# Patient Record
Sex: Male | Born: 1940 | Race: White | Hispanic: No | Marital: Married | Smoking: Former smoker
Health system: Southern US, Community
[De-identification: ages and names within clinical notes are randomized; demographics above are authoritative.]

## PROBLEM LIST (undated history)

## (undated) DIAGNOSIS — E785 Hyperlipidemia, unspecified: Secondary | ICD-10-CM

## (undated) DIAGNOSIS — I77810 Thoracic aortic ectasia: Secondary | ICD-10-CM

## (undated) DIAGNOSIS — I4510 Unspecified right bundle-branch block: Secondary | ICD-10-CM

## (undated) DIAGNOSIS — I071 Rheumatic tricuspid insufficiency: Secondary | ICD-10-CM

## (undated) DIAGNOSIS — D126 Benign neoplasm of colon, unspecified: Secondary | ICD-10-CM

## (undated) DIAGNOSIS — H9191 Unspecified hearing loss, right ear: Secondary | ICD-10-CM

## (undated) DIAGNOSIS — Z136 Encounter for screening for cardiovascular disorders: Secondary | ICD-10-CM

## (undated) DIAGNOSIS — E119 Type 2 diabetes mellitus without complications: Secondary | ICD-10-CM

## (undated) DIAGNOSIS — M109 Gout, unspecified: Secondary | ICD-10-CM

## (undated) DIAGNOSIS — I2510 Atherosclerotic heart disease of native coronary artery without angina pectoris: Secondary | ICD-10-CM

## (undated) DIAGNOSIS — M1990 Unspecified osteoarthritis, unspecified site: Secondary | ICD-10-CM

## (undated) DIAGNOSIS — I10 Essential (primary) hypertension: Secondary | ICD-10-CM

## (undated) DIAGNOSIS — N400 Enlarged prostate without lower urinary tract symptoms: Secondary | ICD-10-CM

## (undated) DIAGNOSIS — G4733 Obstructive sleep apnea (adult) (pediatric): Secondary | ICD-10-CM

## (undated) DIAGNOSIS — I358 Other nonrheumatic aortic valve disorders: Secondary | ICD-10-CM

## (undated) DIAGNOSIS — H3530 Unspecified macular degeneration: Secondary | ICD-10-CM

## (undated) DIAGNOSIS — IMO0002 Reserved for concepts with insufficient information to code with codable children: Secondary | ICD-10-CM

## (undated) DIAGNOSIS — I4892 Unspecified atrial flutter: Secondary | ICD-10-CM

## (undated) HISTORY — DX: Unspecified right bundle-branch block: I45.10

## (undated) HISTORY — DX: Gout, unspecified: M10.9

## (undated) HISTORY — DX: Unspecified hearing loss, right ear: H91.91

## (undated) HISTORY — DX: Encounter for screening for cardiovascular disorders: Z13.6

## (undated) HISTORY — DX: Other nonrheumatic aortic valve disorders: I35.8

## (undated) HISTORY — DX: Essential (primary) hypertension: I10

## (undated) HISTORY — DX: Unspecified atrial flutter: I48.92

## (undated) HISTORY — DX: Reserved for concepts with insufficient information to code with codable children: IMO0002

## (undated) HISTORY — PX: HERNIA REPAIR: SHX51

## (undated) HISTORY — DX: Obstructive sleep apnea (adult) (pediatric): G47.33

## (undated) HISTORY — DX: Unspecified osteoarthritis, unspecified site: M19.90

## (undated) HISTORY — DX: Atherosclerotic heart disease of native coronary artery without angina pectoris: I25.10

## (undated) HISTORY — PX: APPENDECTOMY: SHX54

## (undated) HISTORY — DX: Hyperlipidemia, unspecified: E78.5

## (undated) HISTORY — DX: Unspecified macular degeneration: H35.30

## (undated) HISTORY — DX: Rheumatic tricuspid insufficiency: I07.1

## (undated) HISTORY — DX: Benign neoplasm of colon, unspecified: D12.6

## (undated) HISTORY — DX: Type 2 diabetes mellitus without complications: E11.9

## (undated) HISTORY — PX: BUNIONECTOMY: SHX129

## (undated) HISTORY — DX: Thoracic aortic ectasia: I77.810

## (undated) HISTORY — DX: Benign prostatic hyperplasia without lower urinary tract symptoms: N40.0

---

## 1991-10-07 HISTORY — PX: OTHER SURGICAL HISTORY: SHX169

## 2000-03-09 ENCOUNTER — Encounter: Payer: Self-pay | Admitting: Specialist

## 2000-03-11 ENCOUNTER — Observation Stay (HOSPITAL_COMMUNITY): Admission: RE | Admit: 2000-03-11 | Discharge: 2000-03-12 | Payer: Self-pay | Admitting: Specialist

## 2003-07-07 ENCOUNTER — Ambulatory Visit (HOSPITAL_COMMUNITY): Admission: RE | Admit: 2003-07-07 | Discharge: 2003-07-07 | Payer: Self-pay | Admitting: Cardiology

## 2003-07-24 ENCOUNTER — Ambulatory Visit (HOSPITAL_COMMUNITY): Admission: RE | Admit: 2003-07-24 | Discharge: 2003-07-24 | Payer: Self-pay | Admitting: Emergency Medicine

## 2003-07-24 ENCOUNTER — Encounter (INDEPENDENT_AMBULATORY_CARE_PROVIDER_SITE_OTHER): Payer: Self-pay | Admitting: Specialist

## 2003-07-24 ENCOUNTER — Encounter: Payer: Self-pay | Admitting: Emergency Medicine

## 2003-07-24 ENCOUNTER — Encounter (INDEPENDENT_AMBULATORY_CARE_PROVIDER_SITE_OTHER): Payer: Self-pay | Admitting: *Deleted

## 2003-09-04 ENCOUNTER — Ambulatory Visit (HOSPITAL_COMMUNITY): Admission: RE | Admit: 2003-09-04 | Discharge: 2003-09-04 | Payer: Self-pay | Admitting: Family Medicine

## 2003-09-11 ENCOUNTER — Ambulatory Visit (HOSPITAL_COMMUNITY): Admission: RE | Admit: 2003-09-11 | Discharge: 2003-09-11 | Payer: Self-pay | Admitting: Cardiology

## 2004-07-24 ENCOUNTER — Encounter: Admission: RE | Admit: 2004-07-24 | Discharge: 2004-07-24 | Payer: Self-pay | Admitting: Otolaryngology

## 2004-10-06 HISTORY — PX: OTHER SURGICAL HISTORY: SHX169

## 2005-11-21 ENCOUNTER — Encounter: Admission: RE | Admit: 2005-11-21 | Discharge: 2005-11-21 | Payer: Self-pay | Admitting: Neurosurgery

## 2005-12-05 ENCOUNTER — Encounter: Admission: RE | Admit: 2005-12-05 | Discharge: 2005-12-05 | Payer: Self-pay | Admitting: Neurosurgery

## 2007-11-01 ENCOUNTER — Encounter: Admission: RE | Admit: 2007-11-01 | Discharge: 2007-11-01 | Payer: Self-pay | Admitting: Cardiology

## 2007-11-02 ENCOUNTER — Ambulatory Visit (HOSPITAL_COMMUNITY): Admission: RE | Admit: 2007-11-02 | Discharge: 2007-11-02 | Payer: Self-pay | Admitting: Cardiology

## 2011-02-21 NOTE — Op Note (Signed)
[facility]  Patient:    Austin Mcgrath, Austin Mcgrath                     MRN: [id number] Proc. Date: 03/11/00 Adm. Date:  [id number] Disc. Date: [id number] Attending:  Pierce Crane                           Operative Report  PREOPERATIVE DIAGNOSES:  Rotator cuff tear, acromioclavicular arthrosis, impingement syndrome of the right shoulder.  POSTOPERATIVE DIAGNOSES:  Rotator cuff tear, acromioclavicular arthrosis, impingement syndrome of the right shoulder.  PROCEDURE:  Open rotator cuff repair, subacromial decompression, acromioplasty, distal clavicle resection.  ANESTHESIA:  General.  ASSISTANT:  [place] _____.  BRIEF HISTORY AND INDICATIONS:  A 70 year old with refractory shoulder pain, with MRI indicating rotator cuff tear, AC arthrosis, positive impingement, refractory to conservative treatment.  Operative intervention was indicated for decompression of the subacromial joint and of the [facility] joint by distal clavicle resection and acromioplasty, resection of the rotator cuff with possible repair if found significant.  Risks and benefits discussed, including bleeding, infection, damage to vital structures, need for prolonged postoperative immobilization, postoperative extended rehabilitation was discussed in detail, and the possibility of a return tear.  OPERATIVE TECHNIQUE:  Patient supine in beach chair position.  After induction of adequate general anesthesia, 1 g of Kefzol IV was given for antimicrobial prophylaxis.  The right shoulder and upper extremity were prepped and draped in the usual sterile fashion.  An incision was made dissecting the anterior acromion on Langers lines.  This was after 0.25% Marcaine with epinephrine was infiltrated in the subcutaneous tissue.  Electrocautery was utilized to achieve hemostasis.  The raphe between the anterior and lateral heads of the deltoid was identified, divided, and developed.  Subperiosteal  elevator from the anterior aspect of the acromion and off the distal end of the [facility] joint. The distal clavicle was skeletonized.  The [facility] joint was found to be severely arthritic with osteophytic spurs protruding inferiorly off the acromial end of the clavicle.  With a Homan and a _____ retractor protecting the rotator cuff and soft tissues, 1 cm of the distal clavicle was removed with the oscillating saw and further contoured with a Beyer rongeur.  Osteophytes off the acromion were also removed from its undersurface. Oscillating saw was utilized to perform an anterior acromioplasty, further contoured utilizing the high-speed bur.  This decompressed the subacromial joint, was found to be significantly impinged.  The hypertrophic bursa was noted and excised.  The rotator cuff was found on its attachment to the greater tuberosity.  The supraspinatus was found to be torn, 1 cm in length x 0.5 cm in width.  It was a near-complete tear.  Some devitalized tissue was noted, and this was excised with a 15 to good bleeding tissue and the rotator cuff tendon was then repaired side-to-side with #1 Vicryl interrupted figure-of-eight sutures.  Excellent repair was noted.  Full inspection of the residual rotator cuff revealed no evidence of additional tear.  No evidence of residual compression noted.  The [facility] joint was free as well.  Wound then copiously irrigated with antibiotic irrigation.  The CA ligament had been excised.  The raphe between the anterior and lateral heads was repaired with #1 Vicryl interrupted figure-of-eight sutures to the acromion.  The capsule over the [facility] joint was repaired with #1 Vicryl interrupted figure-of-eight sutures.  The stability of the distal clavicle  was noted to be preserved.  Excellent repair of the deltoid the acromion was noted.  The shoulder was then ranged, and it was found to be intact without undue tension noted on the rotator cuff repair site.  Next,  the subcutaneous tissue was reapproximated with 2-0 Vicryl simple sutures.  Skin was reapproximated with a 4-0 subcuticular Prolene, reinforced with Steri-Strips, which were sterilely placed, and he was placed in an abduction pillow, extubated without difficulty, and transported to the recovery room in satisfactory condition.  The patient tolerated the procedure well.  There were no complications. DD:  03/11/00 TD:  03/14/00 Job: [id number]

## 2011-02-21 NOTE — Cardiovascular Report (Signed)
Austin Mcgrath, Austin Mcgrath              ACCOUNT NO.:  [id number]   MEDICAL RECORD NO.:  [id number]          PATIENT TYPE:  OIB   LOCATION:  2899                         FACILITY:  [facility]   PHYSICIAN:  Armanda Magic, M.D.     DATE OF BIRTH:  Jul 14, 1941   DATE OF PROCEDURE:  DATE OF DISCHARGE:  11/02/2007                            CARDIAC CATHETERIZATION   PROCEDURE:  Left heart catheterization, coronary angiography, left  ventriculography.   OPERATOR:  Armanda Magic, MD.   INDICATIONS:  Chest pain, abnormal Cardiolite.   COMPLICATIONS:  None.   IV access via right femoral artery 6-French sheath.  IV medications  none.   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  He  now presents for cardiac catheterization.   HOSPITAL COURSE:  The patient was brought to the cardiac catheterization  laboratory in a fasting nonsedated state.  Informed consent was  obtained.  The patient was connected to continuous heart rate and pulse  oximetry monitoring, and intermittent blood pressure monitoring.  The  right groin was prepped and draped in a sterile fashion.  Xylocaine 1%  was used for local anesthesia.  Using modified Seldinger technique, a 6-  French sheath was placed in the right femoral artery.  Under  fluoroscopic guidance, a 6-French JL-4 catheter was placed in the left  coronary artery.  Multiple cine films were taken at 33 RAO and 43 LAO  views.  This catheter was then exchanged out over a guidewire for a 6-  [place] JR-4 catheter which was placed under fluoroscopic guidance to the  right coronary artery.  Multiple cine films were taken at 33 RAO and 43  LAO views.  This catheter was then exchanged out over a guidewire for a  6-French angled pigtail catheter which was placed under fluoroscopic  guidance in the left ventricular  cavity.  Left ventriculography was  performed in 30 degree RAO view using a total of 30 cc of contrast at 15  cc per second.  The catheter was then pulled back across the aortic  valve with no significant gradient noted.  At the end of the procedure,  all catheters and sheaths were removed.  Manual compression was  performed till adequate hemostasis was obtained.  The patient was  transferred back to the room in stable condition.   RESULTS:  Left main coronary is widely patent and bifurcates in the left  anterior descending artery and left circumflex artery.   The left anterior descending artery is widely patent throughout its  course to the apex with luminal irregularities.  It gives rise to 1  moderate-sized diagonal branch which is widely patent and bifurcates  into 2 daughter branches both of which are widely patent.   The left circumflex is widely patent throughout its course in the AV  grooves giving rise to a first obtuse marginal branch which is widely  patent and bifurcates into 2 daughter branches both of which are widely  patent.  The ongoing left circumflex terminates in the second obtuse  marginal branch which is widely patent.   The right coronary artery is widely patent in its proximal portion.  There is a 30-40% narrowing in the proximal to midportion and then the  vessel is widely patent throughout its course distally bifurcating into  posterior descending artery and posterolateral artery both of which are  widely patent.   Left ventriculography shows normal LV function, EF 60%, LVEDP 12-17  mmHg.  LV pressure 167/12 mmHg, aortic pressure 166/75 mmHg.   ASSESSMENT:  1. Noncardiac chest pain.  2. Nonobstructive coronary disease.  3. Normal left ventricular function.   PLAN:  Discharge home after IV fluid and bedrest.  Continue aspirin.  We  will decrease K-Dur to 10 mEq a day because his potassium was 5.1 on  admission.  He will follow up with a nurse  practitioner in 2 weeks for  groin check and follow up with me in 2-3 months.      Armanda Magic, M.D.  Electronically Signed     TT/MEDQ  D:  11/04/2007  T:  11/04/2007  Job:  [id number]

## 2011-05-07 ENCOUNTER — Other Ambulatory Visit: Payer: Self-pay | Admitting: Gastroenterology

## 2011-10-13 ENCOUNTER — Ambulatory Visit
Admission: RE | Admit: 2011-10-13 | Discharge: 2011-10-13 | Disposition: A | Discharge status: Home / Self Care | Payer: Medicare Other | Source: Ambulatory Visit | Attending: Family Medicine | Admitting: Family Medicine

## 2011-10-13 ENCOUNTER — Other Ambulatory Visit: Payer: Self-pay | Admitting: Family Medicine

## 2011-10-13 DIAGNOSIS — J069 Acute upper respiratory infection, unspecified: Secondary | ICD-10-CM

## 2011-10-22 ENCOUNTER — Other Ambulatory Visit: Payer: Self-pay | Admitting: Family Medicine

## 2011-10-22 ENCOUNTER — Ambulatory Visit
Admission: RE | Admit: 2011-10-22 | Discharge: 2011-10-22 | Disposition: A | Discharge status: Home / Self Care | Payer: Medicare Other | Source: Ambulatory Visit | Attending: Family Medicine | Admitting: Family Medicine

## 2011-10-22 DIAGNOSIS — J189 Pneumonia, unspecified organism: Secondary | ICD-10-CM

## 2011-11-04 ENCOUNTER — Encounter (INDEPENDENT_AMBULATORY_CARE_PROVIDER_SITE_OTHER): Payer: Medicare Other | Admitting: Ophthalmology

## 2011-11-04 DIAGNOSIS — H35419 Lattice degeneration of retina, unspecified eye: Secondary | ICD-10-CM

## 2011-11-04 DIAGNOSIS — H35039 Hypertensive retinopathy, unspecified eye: Secondary | ICD-10-CM

## 2011-11-04 DIAGNOSIS — H3530 Unspecified macular degeneration: Secondary | ICD-10-CM

## 2011-11-04 DIAGNOSIS — I10 Essential (primary) hypertension: Secondary | ICD-10-CM

## 2011-11-04 DIAGNOSIS — H35379 Puckering of macula, unspecified eye: Secondary | ICD-10-CM

## 2012-02-04 DIAGNOSIS — Z136 Encounter for screening for cardiovascular disorders: Secondary | ICD-10-CM

## 2012-02-04 HISTORY — DX: Encounter for screening for cardiovascular disorders: Z13.6

## 2012-11-04 ENCOUNTER — Ambulatory Visit (INDEPENDENT_AMBULATORY_CARE_PROVIDER_SITE_OTHER): Payer: Medicare Other | Admitting: Ophthalmology

## 2012-11-04 DIAGNOSIS — H3530 Unspecified macular degeneration: Secondary | ICD-10-CM

## 2012-11-04 DIAGNOSIS — H2510 Age-related nuclear cataract, unspecified eye: Secondary | ICD-10-CM

## 2012-11-04 DIAGNOSIS — H35379 Puckering of macula, unspecified eye: Secondary | ICD-10-CM

## 2012-11-04 DIAGNOSIS — H43819 Vitreous degeneration, unspecified eye: Secondary | ICD-10-CM

## 2012-11-04 DIAGNOSIS — H35039 Hypertensive retinopathy, unspecified eye: Secondary | ICD-10-CM

## 2012-11-04 DIAGNOSIS — I10 Essential (primary) hypertension: Secondary | ICD-10-CM

## 2012-11-04 DIAGNOSIS — E1139 Type 2 diabetes mellitus with other diabetic ophthalmic complication: Secondary | ICD-10-CM

## 2013-07-20 ENCOUNTER — Telehealth: Payer: Self-pay | Admitting: Cardiology

## 2013-07-20 ENCOUNTER — Telehealth: Payer: Self-pay | Admitting: General Surgery

## 2013-07-20 NOTE — Telephone Encounter (Signed)
rx ordered. [id number] is the order number 7-10 days to receive order.

## 2013-07-20 NOTE — Telephone Encounter (Signed)
Pt needs [place] to call [employer] to get him a refill on his caduet 10/80 [phone]

## 2013-07-20 NOTE — Telephone Encounter (Signed)
Pt is aware.  

## 2013-07-20 NOTE — Telephone Encounter (Signed)
ID Number [id number]

## 2013-07-20 NOTE — Telephone Encounter (Signed)
Walk In pt Form " Needs Script" Refill gave to Danielle/Turner 07/20/13/KM

## 2013-07-27 ENCOUNTER — Telehealth: Payer: Self-pay | Admitting: General Surgery

## 2013-07-27 NOTE — Telephone Encounter (Signed)
Received Pts [name] 10/80 Mg. Pt is aware to pick up front.

## 2013-10-04 ENCOUNTER — Encounter: Payer: Self-pay | Admitting: General Surgery

## 2013-10-04 DIAGNOSIS — I10 Essential (primary) hypertension: Secondary | ICD-10-CM | POA: Insufficient documentation

## 2013-10-04 DIAGNOSIS — I2510 Atherosclerotic heart disease of native coronary artery without angina pectoris: Secondary | ICD-10-CM | POA: Insufficient documentation

## 2013-10-04 DIAGNOSIS — I119 Hypertensive heart disease without heart failure: Secondary | ICD-10-CM

## 2013-10-04 DIAGNOSIS — E785 Hyperlipidemia, unspecified: Secondary | ICD-10-CM

## 2013-10-20 ENCOUNTER — Telehealth: Payer: Self-pay | Admitting: General Surgery

## 2013-10-20 ENCOUNTER — Ambulatory Visit (INDEPENDENT_AMBULATORY_CARE_PROVIDER_SITE_OTHER): Payer: Medicare Other | Admitting: Cardiology

## 2013-10-20 ENCOUNTER — Encounter: Payer: Self-pay | Admitting: Cardiology

## 2013-10-20 VITALS — BP 122/62 | HR 47 | Ht 69.5 in | Wt 242.0 lb

## 2013-10-20 DIAGNOSIS — I10 Essential (primary) hypertension: Secondary | ICD-10-CM

## 2013-10-20 DIAGNOSIS — G4710 Hypersomnia, unspecified: Secondary | ICD-10-CM

## 2013-10-20 DIAGNOSIS — E785 Hyperlipidemia, unspecified: Secondary | ICD-10-CM

## 2013-10-20 DIAGNOSIS — I2510 Atherosclerotic heart disease of native coronary artery without angina pectoris: Secondary | ICD-10-CM

## 2013-10-20 NOTE — Progress Notes (Signed)
[address], [place], [place]  [address] Phone: [phone] Fax:  [phone]  Date:  10/20/2013   ID:  Desmond, Szabo 18-Dec-1940, MRN [id number]  PCP:  Lujean Amel, MD  Cardiologist:  Fransico Him, MD     History of Present Illness: Austin Mcgrath is a 73 y.o. male with a history of nonobstructive ASCAD, HTN, dyslipidemia who presents today for followup.  He is doing well.  He denies any chest pain, SOB, DOE, LE edema, dizziness, palpitations or syncope.  He occasionally has some electrical shooting pains that start in his back and radiate around to his right chest.  He complains of daytime sleepiness and his wife says that he has severe snoring and has apneic episodes   Wt Readings from Last 3 Encounters:  10/20/13 242 lb (109.77 kg)  10/04/13 240 lb (108.863 kg)     Past Medical History  Diagnosis Date  . Arteriosclerotic coronary artery disease     nonobstructive/Mild AI -DR Radford Pax  . 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 Burnett Harry  . Hypertension   . Dyslipidemia     elevated cholesterol-Dr Radford Pax  . Aortic valve sclerosis     No etenosis Echo 2012 Mild TR  . TR (tricuspid regurgitation)     Mild  . Gout     Dr Justine Null  . OA (osteoarthritis)     Dr Justine Null  . BPH (benign prostatic hypertrophy)   . 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 Degenerative disc disease     Dr Cyndy Freeze  . Tubular adenoma of colon     colonoscopy 05/2011 repeat in 5 years -Dr. Watt Climes  . Macular degeneration     mild hypertensive retinopaty,macular degeneration,referred to retina   . Screening for AAA (aortic abdominal aneurysm) 02/2012    No aneurysm    Current Outpatient Prescriptions  Medication Sig Dispense Refill  . allopurinol (ZYLOPRIM) 300 MG tablet Take 300 mg by mouth daily.      Marland Kitchen amLODipine-atorvastatin ([name]) 10-80 MG per tablet Take 1 tablet by mouth daily.      Marland Kitchen aspirin 81 MG tablet Take 81 mg by mouth daily.      . Cholecalciferol  (VITAMIN D-3) 1000 UNITS CAPS Take 1 capsule by mouth daily.      . hydrochlorothiazide (HYDRODIURIL) 25 MG tablet Take 25 mg by mouth daily.      Marland Kitchen ibuprofen (ADVIL,MOTRIN) 200 MG tablet Take 400 mg by mouth as needed.      . metoprolol succinate (TOPROL-XL) 25 MG 24 hr tablet Take 25 mg by mouth daily.      . Multiple Vitamins-Minerals (CENTRUM CARDIO PO) Take 1 tablet by mouth 2 (two) times daily.      . Multiple Vitamins-Minerals (ICAPS MV PO) Take 1 tablet by mouth 2 (two) times daily.      . Omega-3 Fatty Acids (FISH OIL) 1000 MG CAPS Take 4 capsules by mouth daily.      . pantoprazole (PROTONIX) 40 MG tablet Take 40 mg by mouth daily.      . potassium chloride SA (K-DUR,KLOR-CON) 20 MEQ tablet Take 20 mEq by mouth 2 (two) times daily.      . benazepril (LOTENSIN) 40 MG tablet Take 40 mg by mouth daily.       No current facility-administered medications for this visit.    Allergies:    Allergies  Allergen Reactions  . Contrast Media [Iodinated Diagnostic Agents]  Feet blistered    Social History:  The patient  reports that he quit smoking about 42 years ago. He does not have any smokeless tobacco history on file. He reports that he does not drink alcohol or use illicit drugs.   Family History:  The patient's family history is not on file.   ROS:  Please see the history of present illness.      All other systems reviewed and negative.   PHYSICAL EXAM: VS:  BP 122/62  Pulse 47  Ht 5' 9.5" (1.765 m)  Wt 242 lb (109.77 kg)  BMI 35.24 kg/m2 Well nourished, well developed, in no acute distress HEENT: normal Neck: no JVD Cardiac:  normal S1, S2; RRR; no murmur Lungs:  clear to auscultation bilaterally, no wheezing, rhonchi or rales Abd: soft, nontender, no hepatomegaly Ext: no edema Skin: warm and dry Neuro:  CNs 2-12 intact, no focal abnormalities noted  EKG:     Sinus bradycardia with IRBBB  ASSESSMENT AND PLAN:  1. Nonobstructive ASCAD -with no angina  - continue  ASA 2. HTN - well controlled  - continue  Benazepril/HCTZ/Metoprolol/[name] 3. Dyslipidemia - at goal  - continue [name] 4.  Asymptomatic bradycardia  5.  Probable OSA with hypersomnia and severe snoring  - split night PSG  Followup with me in 1 year  Signed, Fransico Him, MD 10/20/2013 9:57 AM

## 2013-10-20 NOTE — Patient Instructions (Signed)
Your physician recommends that you continue on your current medications as directed. Please refer to the Current Medication list given to you today.  Your physician has recommended that you have a sleep study. This test records several body functions during sleep, including: brain activity, eye movement, oxygen and carbon dioxide blood levels, heart rate and rhythm, breathing rate and rhythm, the flow of air through your mouth and nose, snoring, body muscle movements, and chest and belly movement.  Your physician wants you to follow-up in: 1 Year with Dr Mallie Snooks will receive a reminder letter in the mail two months in advance. If you don't receive a letter, please call our office to schedule the follow-up appointment.

## 2013-10-20 NOTE — Telephone Encounter (Signed)
Ordered 3 months worth of caduet 10/80 for pt.   Reference number is [id number]  We will be receiving in 7-10 days.

## 2013-10-22 ENCOUNTER — Other Ambulatory Visit: Payer: Self-pay | Admitting: *Deleted

## 2013-10-22 DIAGNOSIS — I2510 Atherosclerotic heart disease of native coronary artery without angina pectoris: Secondary | ICD-10-CM

## 2013-10-22 DIAGNOSIS — Z79899 Other long term (current) drug therapy: Secondary | ICD-10-CM

## 2013-10-25 ENCOUNTER — Telehealth: Payer: Self-pay

## 2013-10-25 NOTE — Telephone Encounter (Signed)
Called patient to pick up caduet

## 2013-10-28 ENCOUNTER — Other Ambulatory Visit (INDEPENDENT_AMBULATORY_CARE_PROVIDER_SITE_OTHER): Payer: Medicare Other

## 2013-10-28 DIAGNOSIS — I2510 Atherosclerotic heart disease of native coronary artery without angina pectoris: Secondary | ICD-10-CM

## 2013-10-28 DIAGNOSIS — Z79899 Other long term (current) drug therapy: Secondary | ICD-10-CM

## 2013-10-28 LAB — ALT: ALT: 25 U/L (ref 0–53)

## 2013-10-31 ENCOUNTER — Other Ambulatory Visit: Payer: Self-pay

## 2013-10-31 LAB — NMR LIPOPROFILE WITH LIPIDS
CHOLESTEROL, TOTAL: 111 mg/dL (ref <200)
HDL PARTICLE NUMBER: 27.3 umol/L — AB (ref >=30.5)
HDL Size: 8.3 nm — ABNORMAL LOW (ref >=9.2)
HDL-C: 37 mg/dL — AB (ref >=40)
LARGE HDL: 1.6 umol/L — AB (ref >=4.8)
LDL (calc): 45 mg/dL (ref <100)
LDL Particle Number: 746 nmol/L (ref <1000)
LDL SIZE: 20 nm — AB (ref >20.5)
LP-IR Score: 75 — ABNORMAL HIGH (ref <=45)
Large VLDL-P: 4.2 nmol/L — ABNORMAL HIGH (ref <=2.7)
Small LDL Particle Number: 480 nmol/L (ref <=527)
Triglycerides: 146 mg/dL (ref <150)
VLDL Size: 49.2 nm — ABNORMAL HIGH (ref <=46.6)

## 2013-10-31 MED ORDER — HYDROCHLOROTHIAZIDE 25 MG PO TABS: 25.0000 mg | ORAL_TABLET | Freq: Every day | ORAL | Status: DC

## 2013-10-31 MED ORDER — METOPROLOL SUCCINATE ER 25 MG PO TB24: 25.0000 mg | ORAL_TABLET | Freq: Every day | ORAL | Status: DC

## 2013-11-01 ENCOUNTER — Telehealth: Payer: Self-pay | Admitting: Cardiology

## 2013-11-01 NOTE — Telephone Encounter (Signed)
Please let patient know that he has severe OSA and please set up for CPAP titration ASAP

## 2013-11-02 ENCOUNTER — Other Ambulatory Visit: Payer: Self-pay | Admitting: General Surgery

## 2013-11-02 ENCOUNTER — Encounter: Payer: Self-pay | Admitting: General Surgery

## 2013-11-02 MED ORDER — METOPROLOL TARTRATE 25 MG PO TABS: 25.0000 mg | ORAL_TABLET | Freq: Two times a day (BID) | ORAL | Status: DC

## 2013-11-02 MED ORDER — BENAZEPRIL HCL 40 MG PO TABS: 40.0000 mg | ORAL_TABLET | Freq: Every day | ORAL | Status: DC

## 2013-11-02 NOTE — Telephone Encounter (Signed)
Form filled out for titration 

## 2013-11-02 NOTE — Telephone Encounter (Signed)
Pt is aware.  

## 2013-11-07 ENCOUNTER — Ambulatory Visit (INDEPENDENT_AMBULATORY_CARE_PROVIDER_SITE_OTHER): Payer: Medicare Other | Admitting: Ophthalmology

## 2013-11-07 DIAGNOSIS — H2510 Age-related nuclear cataract, unspecified eye: Secondary | ICD-10-CM

## 2013-11-07 DIAGNOSIS — E11319 Type 2 diabetes mellitus with unspecified diabetic retinopathy without macular edema: Secondary | ICD-10-CM

## 2013-11-07 DIAGNOSIS — E1165 Type 2 diabetes mellitus with hyperglycemia: Secondary | ICD-10-CM

## 2013-11-07 DIAGNOSIS — I10 Essential (primary) hypertension: Secondary | ICD-10-CM

## 2013-11-07 DIAGNOSIS — E1139 Type 2 diabetes mellitus with other diabetic ophthalmic complication: Secondary | ICD-10-CM

## 2013-11-07 DIAGNOSIS — H43819 Vitreous degeneration, unspecified eye: Secondary | ICD-10-CM

## 2013-11-07 DIAGNOSIS — H35039 Hypertensive retinopathy, unspecified eye: Secondary | ICD-10-CM

## 2013-11-07 DIAGNOSIS — H3530 Unspecified macular degeneration: Secondary | ICD-10-CM

## 2013-11-14 ENCOUNTER — Telehealth: Payer: Self-pay | Admitting: Cardiology

## 2013-11-14 NOTE — Telephone Encounter (Signed)
Please let patient know that patient had successful CPAP titration to 11cm H2O and set up OV with me in 10 weeks

## 2013-11-14 NOTE — Telephone Encounter (Signed)
Pt is aware and set up for 10 Week OV on 01/25/14.

## 2013-11-25 ENCOUNTER — Telehealth: Payer: Self-pay | Admitting: Cardiology

## 2013-11-25 DIAGNOSIS — G4733 Obstructive sleep apnea (adult) (pediatric): Secondary | ICD-10-CM

## 2013-11-25 NOTE — Telephone Encounter (Signed)
° °  Pt request a call back to have the pressure for his sleep mask changed// He states that it is currently 11 lbs of pressure.. And that it may need to be raised?. Please call back to assist.

## 2013-11-28 NOTE — Telephone Encounter (Signed)
Called pt to get a little more information about his request. Pt states that he was told by [facility] that his pressure needed to be raised due to his download. Pt spoke with Trae. I will call [facility] to confirm this. Pts AHI is still too high, They will send over a report to let us know what his AHI was. I gave them our fax number and will send this to Dr Radford Pax to make her aware. I will put this pts Download in to be signed folder with note stating see telephone encounter.

## 2013-11-29 NOTE — Telephone Encounter (Signed)
AHI on download is 11.9/hr on 11cm H2O.  Please get a 2 week autotitration from 4 to 20cm H2O

## 2013-11-29 NOTE — Telephone Encounter (Signed)
PT is aware and order sent to [facility]

## 2013-11-29 NOTE — Addendum Note (Signed)
Addended by: Lily Kocher on: 11/29/2013 10:02 AM   Modules accepted: Orders

## 2013-12-09 ENCOUNTER — Encounter: Payer: Self-pay | Admitting: Cardiology

## 2013-12-15 ENCOUNTER — Encounter: Payer: Self-pay | Admitting: Cardiology

## 2013-12-20 ENCOUNTER — Encounter: Payer: Self-pay | Admitting: Cardiology

## 2014-01-04 ENCOUNTER — Encounter: Payer: Self-pay | Admitting: Cardiology

## 2014-01-18 ENCOUNTER — Encounter: Payer: Self-pay | Admitting: Cardiology

## 2014-01-19 ENCOUNTER — Telehealth: Payer: Self-pay | Admitting: Cardiology

## 2014-01-19 NOTE — Telephone Encounter (Signed)
New message     Pt had sleep study last night.  Should he still come for his next appt?

## 2014-01-19 NOTE — Telephone Encounter (Signed)
Reviewed with Dr. Radford Pax.  Patient will need to come in for appointment 10 weeks after she reviews results of last night's sleep trial.   Appointment cancelled for 4/22.   Per Dr. Radford Pax, Jodi Mourning, (CMA) will make the f/u appointment at appropriate time. Patient aware of all of the above and verbalizes understanding.

## 2014-01-20 NOTE — Telephone Encounter (Signed)
Follow up     Talk to [facility] regarding ordering some medication.

## 2014-01-20 NOTE — Telephone Encounter (Signed)
Pt called needing refill of caduet 10/80 MG from [place] number   [phone]  Pts ID number [id number]  Rx ordered for pt and pt is aware Order # [id number] will receive in 7-10 days

## 2014-01-22 ENCOUNTER — Telehealth: Payer: Self-pay | Admitting: Cardiology

## 2014-01-22 NOTE — Telephone Encounter (Signed)
Please let patient know that he had successful BiPAP titration to 13/9cm H2O but needs to sleep on his left side because he was not adequately titrated on his right side or supine.  Please set up 10 week followup with me

## 2014-01-24 NOTE — Telephone Encounter (Signed)
Pt notified. 10 wk sleep f/u scheduled.

## 2014-01-25 ENCOUNTER — Ambulatory Visit: Payer: Medicare Other | Admitting: Cardiology

## 2014-01-31 NOTE — Telephone Encounter (Signed)
Thank you! If he calls back and they transfer to me I will let him know it is available to pick up.

## 2014-01-31 NOTE — Telephone Encounter (Signed)
Received [name] from company, called patient and left him message.

## 2014-03-01 ENCOUNTER — Encounter: Payer: Self-pay | Admitting: Cardiology

## 2014-04-10 ENCOUNTER — Encounter: Payer: Self-pay | Admitting: Cardiology

## 2014-04-10 ENCOUNTER — Ambulatory Visit (INDEPENDENT_AMBULATORY_CARE_PROVIDER_SITE_OTHER): Payer: Medicare Other | Admitting: Cardiology

## 2014-04-10 ENCOUNTER — Telehealth: Payer: Self-pay | Admitting: Cardiology

## 2014-04-10 ENCOUNTER — Telehealth: Payer: Self-pay | Admitting: *Deleted

## 2014-04-10 VITALS — BP 120/68 | HR 49 | Ht 69.5 in | Wt 233.0 lb

## 2014-04-10 DIAGNOSIS — G4733 Obstructive sleep apnea (adult) (pediatric): Secondary | ICD-10-CM

## 2014-04-10 DIAGNOSIS — I2510 Atherosclerotic heart disease of native coronary artery without angina pectoris: Secondary | ICD-10-CM

## 2014-04-10 DIAGNOSIS — I498 Other specified cardiac arrhythmias: Secondary | ICD-10-CM

## 2014-04-10 DIAGNOSIS — E785 Hyperlipidemia, unspecified: Secondary | ICD-10-CM

## 2014-04-10 DIAGNOSIS — R001 Bradycardia, unspecified: Secondary | ICD-10-CM | POA: Insufficient documentation

## 2014-04-10 DIAGNOSIS — T50905A Adverse effect of unspecified drugs, medicaments and biological substances, initial encounter: Secondary | ICD-10-CM

## 2014-04-10 DIAGNOSIS — I10 Essential (primary) hypertension: Secondary | ICD-10-CM

## 2014-04-10 MED ORDER — METOPROLOL TARTRATE 25 MG PO TABS: 12.5000 mg | ORAL_TABLET | Freq: Two times a day (BID) | ORAL | Status: DC

## 2014-04-10 NOTE — Patient Instructions (Signed)
Your physician has recommended you make the following change in your medication: 1. Decrease your metoprolol to 25 MG 1/2 tablet twice a day  Your physician recommends that you schedule a follow-up appointment in: 2 weeks for a Nurse Visit to Check BP and HR  Your physician wants you to follow-up in: 6 months with Dr Mallie Snooks will receive a reminder letter in the mail two months in advance. If you don't receive a letter, please call our office to schedule the follow-up appointment.

## 2014-04-10 NOTE — Telephone Encounter (Signed)
Patient assistant form from [employer] for [place] faxed.

## 2014-04-10 NOTE — Telephone Encounter (Signed)
Please let her know that we decreased his metoprolol dose today

## 2014-04-10 NOTE — Progress Notes (Signed)
[address] [place], [place]  [address] Phone: [phone] Fax:  [phone]  Date:  04/10/2014   ID:  Austin Mcgrath, Kathan Apr 12, 1941, MRN [id number]  PCP:  Lujean Amel, MD  Cardiologist:  Fransico Him, MD     History of Austin Mcgrath is a 73 y.o. male with a history of nonobstructive ASCAD, HTN, OSA and dyslipidemia who presents today for followup. He is doing well. He denies any chest pain, SOB, DOE, LE edema, dizziness, palpitations or syncope. When I last saw him he complained of daytime sleepiness and his wife says that he has severe snoring and has apneic episodes.  He underwent sleep study showing OSA and then underwent BiPAP titration and is on BiPAP at 13/9cm H2O.  He presents today for followup.  He uses the full face mask which he tolerates well.  He feels the pressure is adequate.  He is sleeping better at night without really waking up and feels rested in the am.  He denies any daytime sleepiness.  He says that his pulse has been in the 40's lately.   Present Illness:   Wt Readings from Last 3 Encounters:  04/10/14 233 lb (105.688 kg)  10/20/13 242 lb (109.77 kg)  10/04/13 240 lb (108.863 kg)     Past Medical History  Diagnosis Date  . Arteriosclerotic coronary artery disease     nonobstructive/Mild AI -DR Radford Pax  . 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 Burnett Harry  . Hypertension   . Dyslipidemia     elevated cholesterol-Dr Radford Pax  . Aortic valve sclerosis     No etenosis Echo 2012 Mild TR  . TR (tricuspid regurgitation)     Mild  . Gout     Dr Justine Null  . OA (osteoarthritis)     Dr Justine Null  . BPH (benign prostatic hypertrophy)   . 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 Degenerative disc disease     Dr Cyndy Freeze  . Tubular adenoma of colon     colonoscopy 05/2011 repeat in 5 years -Dr. Watt Climes  . Macular degeneration     mild hypertensive retinopaty,macular degeneration,referred to retina   . Screening for AAA (aortic abdominal  aneurysm) 02/2012    No aneurysm  . OSA (obstructive sleep apnea)     severe with AHI 43/hr now on BiPAP at 13/9cm H2O    Current Outpatient Prescriptions  Medication Sig Dispense Refill  . allopurinol (ZYLOPRIM) 300 MG tablet Take 300 mg by mouth daily.      Marland Kitchen amLODipine-atorvastatin ([name]) 10-80 MG per tablet Take 1 tablet by mouth daily.      Marland Kitchen aspirin 81 MG tablet Take 81 mg by mouth daily.      . benazepril (LOTENSIN) 40 MG tablet Take 1 tablet (40 mg total) by mouth daily.  90 tablet  3  . Cholecalciferol (VITAMIN D-3) 1000 UNITS CAPS Take 1 capsule by mouth daily.      . hydrochlorothiazide (HYDRODIURIL) 25 MG tablet Take 1 tablet (25 mg total) by mouth daily.  30 tablet  6  . ibuprofen (ADVIL,MOTRIN) 200 MG tablet Take 400 mg by mouth as needed.      . metoprolol tartrate (LOPRESSOR) 25 MG tablet Take 1 tablet (25 mg total) by mouth 2 (two) times daily.  180 tablet  3  . Multiple Vitamins-Minerals (CENTRUM CARDIO PO) Take 1 tablet by mouth 2 (two) times daily.      . Multiple Vitamins-Minerals (ICAPS  MV PO) Take 1 tablet by mouth 2 (two) times daily.      . Omega-3 Fatty Acids (FISH OIL) 1000 MG CAPS Take 4 capsules by mouth daily.      . pantoprazole (PROTONIX) 40 MG tablet Take 40 mg by mouth daily.      . potassium chloride SA (K-DUR,KLOR-CON) 20 MEQ tablet Take 20 mEq by mouth 2 (two) times daily.       No current facility-administered medications for this visit.    Allergies:    Allergies  Allergen Reactions  . Contrast Media [Iodinated Diagnostic Agents]     Feet blistered    Social History:  The patient  reports that he quit smoking about 42 years ago. He does not have any smokeless tobacco history on file. He reports that he does not drink alcohol or use illicit drugs.   Family History:  The patient's family history is not on file.   ROS:  Please see the history of present illness.      All other systems reviewed and negative.   PHYSICAL EXAM: VS:  BP 120/68   Pulse 49  Ht 5' 9.5" (1.765 m)  Wt 233 lb (105.688 kg)  BMI 33.93 kg/m2 Well nourished, well developed, in no acute distress HEENT: normal Neck: no JVD Cardiac:  normal S1, S2; RRR; no murmur Lungs:  clear to auscultation bilaterally, no wheezing, rhonchi or rales Abd: soft, nontender, no hepatomegaly Ext: no edema Skin: warm and dry Neuro:  CNs 2-12 intact, no focal abnormalities noted  ASSESSMENT AND PLAN:  1. Nonobstructive ASCAD -with no angina - continue ASA  2. HTN - well controlled - continue Benazepril/HCTZ/Metoprolol/[name]  - decrease metoprolol to 25mg  1/2 tablet BID due to resting bradycardia 3. Dyslipidemia - at goal - continue [name]  4. Asymptomatic bradycardia - will decrease metoprolol to 12.5mg  BID.   5. Severe OSA on BiPAP and tolerating well.  His last d/l with DME showed an AHI of 7.8/hr and 97% compliance in using more than 4 hours nightly.  I have encouraged him to try to avoid sleeping supine.    Nurse visit in 2 weeks for HR/BP check  Followup with me in 6 months    Signed, Fransico Him, MD 04/10/2014 3:20 PM

## 2014-04-10 NOTE — Telephone Encounter (Signed)
To Dr Turner to advise 

## 2014-04-10 NOTE — Telephone Encounter (Signed)
New message     Want Dr Radford Pax to know that his dystolic pressure has been in the 40's and he has been very weak.  Pt will be mad if he knew daughter called about him but she felt like someone should know

## 2014-04-11 NOTE — Telephone Encounter (Signed)
Pt's daughter made aware.

## 2014-04-24 ENCOUNTER — Ambulatory Visit (INDEPENDENT_AMBULATORY_CARE_PROVIDER_SITE_OTHER): Payer: Medicare Other | Admitting: *Deleted

## 2014-04-24 VITALS — BP 119/74 | HR 64 | Resp 16 | Wt 232.0 lb

## 2014-04-24 DIAGNOSIS — I498 Other specified cardiac arrhythmias: Secondary | ICD-10-CM

## 2014-04-24 DIAGNOSIS — I10 Essential (primary) hypertension: Secondary | ICD-10-CM

## 2014-04-24 DIAGNOSIS — R001 Bradycardia, unspecified: Secondary | ICD-10-CM

## 2014-04-24 DIAGNOSIS — T50905A Adverse effect of unspecified drugs, medicaments and biological substances, initial encounter: Secondary | ICD-10-CM

## 2014-04-24 NOTE — Progress Notes (Signed)
1.) Reason for visit: BP and HR check     2.) Name of MD requesting visit: Dr Radford Pax  48.) H&P: 73 year old male with a hx of nonobstructive ASCAD, HTN, OSA, and dyslipidemia. Pt has recently decreased his Metoprolol per Dr Radford Pax due to asymptomatic bradycardia. Pt here today for a f/u BP and HR reading since recent decrease in Metoprolol to 12.5 mg bid.   4.) ROS related to problem: Pt was seen here today for a nurse visit, to follow-up on his HR and BP due to recently decreased Metoprolol.  Pt was seen by Dr Radford Pax on 04/10/14 for a f/u ov. Dr Radford Pax decreased the pts Metoprolol at that visit to 12.5 mg bid due to asymptomatic bradycardia. Pts vital signs today were: BP 119-74 and HR- 64. Pt denies any complaints at all. Pt states "I actually feel much better." Pt is being compliant with all meds prescribed. Informed pt given his v/s were WNL, I will route this message to Dr Radford Pax for further review and recommendation. Informed pt to continue on current med regimen, and f/u as planned per Dr Radford Pax. Pt verbalized understanding and agrees with this plan.   5.) Assessment and plan per MD: Note above reviewed.  BP controlled and bradycardia resolved after decreasing dose of metoprolol.  COntinue current medical regimen.  Fransico Him, MD [facility]

## 2014-05-02 ENCOUNTER — Other Ambulatory Visit: Payer: Self-pay

## 2014-05-02 MED ORDER — HYDROCHLOROTHIAZIDE 25 MG PO TABS: 25.0000 mg | ORAL_TABLET | Freq: Every day | ORAL | Status: DC

## 2014-05-05 ENCOUNTER — Telehealth: Payer: Self-pay

## 2014-05-05 NOTE — Telephone Encounter (Signed)
Left message to let patient know that his caduet was here at the office

## 2014-07-17 ENCOUNTER — Telehealth: Payer: Self-pay | Admitting: *Deleted

## 2014-07-17 NOTE — Telephone Encounter (Signed)
[name] ordered through [employer] for patient. Order number [id number].

## 2014-07-20 ENCOUNTER — Telehealth: Payer: Self-pay

## 2014-07-20 NOTE — Telephone Encounter (Signed)
Called patient to let him know that his caduet was here at the office for him to pick up 

## 2014-10-12 ENCOUNTER — Ambulatory Visit (INDEPENDENT_AMBULATORY_CARE_PROVIDER_SITE_OTHER): Payer: Medicare Other | Admitting: Cardiology

## 2014-10-12 ENCOUNTER — Encounter: Payer: Self-pay | Admitting: Cardiology

## 2014-10-12 VITALS — BP 130/80 | HR 51 | Ht 71.0 in | Wt 245.8 lb

## 2014-10-12 DIAGNOSIS — I10 Essential (primary) hypertension: Secondary | ICD-10-CM

## 2014-10-12 DIAGNOSIS — R001 Bradycardia, unspecified: Secondary | ICD-10-CM

## 2014-10-12 DIAGNOSIS — G4733 Obstructive sleep apnea (adult) (pediatric): Secondary | ICD-10-CM

## 2014-10-12 DIAGNOSIS — E785 Hyperlipidemia, unspecified: Secondary | ICD-10-CM

## 2014-10-12 DIAGNOSIS — I2510 Atherosclerotic heart disease of native coronary artery without angina pectoris: Secondary | ICD-10-CM

## 2014-10-12 DIAGNOSIS — T50905A Adverse effect of unspecified drugs, medicaments and biological substances, initial encounter: Secondary | ICD-10-CM

## 2014-10-12 LAB — LIPID PANEL
CHOLESTEROL: 124 mg/dL (ref 0–200)
HDL: 33.6 mg/dL — AB (ref >39.00)
LDL Cholesterol: 63 mg/dL (ref 0–99)
NonHDL: 90.4
TRIGLYCERIDES: 139 mg/dL (ref 0.0–149.0)
Total CHOL/HDL Ratio: 4
VLDL: 27.8 mg/dL (ref 0.0–40.0)

## 2014-10-12 LAB — ALT: ALT: 36 U/L (ref 0–53)

## 2014-10-12 MED ORDER — BENAZEPRIL HCL 40 MG PO TABS: 40.0000 mg | ORAL_TABLET | Freq: Every day | ORAL | Status: DC

## 2014-10-12 MED ORDER — AMLODIPINE-ATORVASTATIN 10-80 MG PO TABS: 1.0000 | ORAL_TABLET | Freq: Every day | ORAL | Status: DC

## 2014-10-12 MED ORDER — HYDROCHLOROTHIAZIDE 25 MG PO TABS: 25.0000 mg | ORAL_TABLET | Freq: Every day | ORAL | Status: DC

## 2014-10-12 NOTE — Patient Instructions (Addendum)
Your physician has recommended you make the following change in your medication:  1) DECREASE Metoprolol to 12.5 mg ONCE A DAY for 3 days then STOP the medication  Your physician recommends that you have lab work TODAY (lipids, ALT)  Your physician wants you to follow-up in: 6 months with Dr. Radford Pax. You will receive a reminder letter in the mail two months in advance. If you don't receive a letter, please call our office to schedule the follow-up appointment.

## 2014-10-12 NOTE — Progress Notes (Signed)
[address] [place], [place]  [address] Phone: [phone] Fax:  [phone]  Date:  10/12/2014   ID:  Austin, Mcgrath Dec 13, 1940, MRN [id number]  PCP:  Austin Amel, MD  Cardiologist:  [place] Him, MD    History of Present Illness: Austin Mcgrath is a 74 y.o. male with a history of nonobstructive ASCAD, HTN, OSA and dyslipidemia who presents today for followup. He is doing well. He denies any chest pain,LE edema, dizziness, palpitations or syncope. He has chronic SOB that is stable.  He presents today for followup. He says that he feels tired and his PCP is concerned that his pulse is low.  He uses the full face mask which he tolerates well. He feels the pressure is adequate. He is sleeping better at night without really waking up and feels rested in the am. He denies any daytime sleepiness. He says that his pulse has been in the 40's lately.   Wt Readings from Last 3 Encounters:  10/12/14 245 lb 12.8 oz (111.494 kg)  04/24/14 232 lb (105.235 kg)  04/10/14 233 lb (105.688 kg)     Past Medical History  Diagnosis Date  . Arteriosclerotic coronary artery disease     nonobstructive/Mild AI -DR Radford Pax  . 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 Burnett Harry  . Hypertension   . Dyslipidemia     elevated cholesterol-Dr Radford Pax  . Aortic valve sclerosis     No etenosis Echo 2012 Mild TR  . TR (tricuspid regurgitation)     Mild  . Gout     Dr Justine Null  . OA (osteoarthritis)     Dr Justine Null  . BPH (benign prostatic hypertrophy)   . 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 Degenerative disc disease     Dr Cyndy Freeze  . Tubular adenoma of colon     colonoscopy 05/2011 repeat in 5 years -Dr. Watt Climes  . Macular degeneration     mild hypertensive retinopaty,macular degeneration,referred to retina   . Screening for AAA (aortic abdominal aneurysm) 02/2012    No aneurysm  . OSA (obstructive sleep apnea)     severe with AHI 43/hr now on BiPAP at 13/9cm H2O     Current Outpatient Prescriptions  Medication Sig Dispense Refill  . allopurinol (ZYLOPRIM) 300 MG tablet Take 300 mg by mouth daily.    Marland Kitchen amLODipine-atorvastatin ([name]) 10-80 MG per tablet Take 1 tablet by mouth daily.    Marland Kitchen aspirin 81 MG tablet Take 81 mg by mouth daily.    . benazepril (LOTENSIN) 40 MG tablet Take 1 tablet (40 mg total) by mouth daily. 90 tablet 3  . hydrochlorothiazide (HYDRODIURIL) 25 MG tablet Take 1 tablet (25 mg total) by mouth daily. 30 tablet 6  . Multiple Vitamins-Minerals (CENTRUM CARDIO PO) Take 1 tablet by mouth 2 (two) times daily.    . Multiple Vitamins-Minerals (ICAPS MV PO) Take 1 tablet by mouth 2 (two) times daily.    . Omega-3 Fatty Acids (FISH OIL) 1000 MG CAPS Take 4 capsules by mouth daily.    . pantoprazole (PROTONIX) 40 MG tablet Take 40 mg by mouth daily.    . potassium chloride SA (K-DUR,KLOR-CON) 20 MEQ tablet Take 20 mEq by mouth 2 (two) times daily.    Marland Kitchen ibuprofen (ADVIL,MOTRIN) 200 MG tablet Take 400 mg by mouth as needed.    . metoprolol tartrate (LOPRESSOR) 25 MG tablet Take 0.5 tablets (12.5 mg total) by mouth 2 (two)  times daily. (Patient not taking: Reported on 10/12/2014) 90 tablet 3   No current facility-administered medications for this visit.    Allergies:    Allergies  Allergen Reactions  . Contrast Media [Iodinated Diagnostic Agents]     Feet blistered    Social History:  The patient  reports that he quit smoking about 43 years ago. He does not have any smokeless tobacco history on file. He reports that he does not drink alcohol or use illicit drugs.   Family History:  The patient's family history is not on file.   ROS:  Please see the history of present illness.      All other systems reviewed and negative.   PHYSICAL EXAM: VS:  BP 130/80 mmHg  Pulse 51  Ht 5\' 11"  (1.803 m)  Wt 245 lb 12.8 oz (111.494 kg)  BMI 34.30 kg/m2 Well nourished, well developed, in no acute distress HEENT: normal Neck: no JVD Cardiac:   normal S1, S2; RRR; no murmur Lungs:  clear to auscultation bilaterally, no wheezing, rhonchi or rales Abd: soft, nontender, no hepatomegaly Ext: no edema Skin: warm and dry Neuro:  CNs 2-12 intact, no focal abnormalities noted  EKG:  Sinus bradycardia with PVC"s and nonspecific T wave abnormality     ASSESSMENT AND PLAN:  1. Nonobstructive ASCAD -with no angina - continue ASA  2. HTN - well controlled - continue Benazepril/HCTZ/[name]        3.  Dyslipidemia  - continue [name]  - check FLP and ALT      4. Bradycardia with some fatigue - I have instructed Mcgrath to decrease his metoprolol to 1/2 tab daily for 3 days and then stop.        5. Severe OSA on BiPAP and tolerating well.His d/l showed an AHI of 4.3/hr on 13/9cm H2O and 97% compliance in using more than 4 hours nightly.  He will continue on current settings  Followup with me in 6 months   Signed, [place] Him, MD [facility] 10/12/2014 9:12 AM

## 2014-10-13 ENCOUNTER — Telehealth: Payer: Self-pay

## 2014-10-13 NOTE — Telephone Encounter (Signed)
Patient st he needs [name] renewed from [employer] through the assistance program. Routing to [place], Therapist, sports for follow up.

## 2014-10-16 NOTE — Telephone Encounter (Signed)
Informed patient's wife that medication has been ordered and they will be called when to pick it up. Both patient and wife very grateful.

## 2014-10-16 NOTE — Telephone Encounter (Signed)
[name] 10/80 ordered from [employer], will take 7-10 days for delivery. Patient Identifier-[id number]. Order #[id number].

## 2014-10-19 ENCOUNTER — Encounter: Payer: Self-pay | Admitting: Cardiology

## 2014-10-24 ENCOUNTER — Telehealth: Payer: Self-pay | Admitting: *Deleted

## 2014-10-24 NOTE — Telephone Encounter (Signed)
Spoke with patient to let him know that his caduet had arrived from the company and was at the front desk for pick up.

## 2014-11-07 ENCOUNTER — Ambulatory Visit (INDEPENDENT_AMBULATORY_CARE_PROVIDER_SITE_OTHER): Payer: Medicare Other | Admitting: Ophthalmology

## 2014-11-07 DIAGNOSIS — H2513 Age-related nuclear cataract, bilateral: Secondary | ICD-10-CM

## 2014-11-07 DIAGNOSIS — I10 Essential (primary) hypertension: Secondary | ICD-10-CM

## 2014-11-07 DIAGNOSIS — H3531 Nonexudative age-related macular degeneration: Secondary | ICD-10-CM

## 2014-11-07 DIAGNOSIS — E11319 Type 2 diabetes mellitus with unspecified diabetic retinopathy without macular edema: Secondary | ICD-10-CM

## 2014-11-07 DIAGNOSIS — H43813 Vitreous degeneration, bilateral: Secondary | ICD-10-CM

## 2014-11-07 DIAGNOSIS — H35033 Hypertensive retinopathy, bilateral: Secondary | ICD-10-CM

## 2014-11-07 DIAGNOSIS — E11329 Type 2 diabetes mellitus with mild nonproliferative diabetic retinopathy without macular edema: Secondary | ICD-10-CM

## 2014-11-07 LAB — HM DIABETES EYE EXAM

## 2014-11-10 ENCOUNTER — Encounter: Payer: Self-pay | Admitting: Internal Medicine

## 2015-01-18 ENCOUNTER — Telehealth: Payer: Self-pay | Admitting: Cardiology

## 2015-01-18 NOTE — Telephone Encounter (Signed)
Called phifer to order caduet com # is [phone]

## 2015-01-18 NOTE — Telephone Encounter (Signed)
New message      Refill caduet 10-80.  Please call this in to the drug assistance program not the pharmacy.  Call if you have any questions

## 2015-01-24 ENCOUNTER — Encounter: Payer: Self-pay | Admitting: Cardiology

## 2015-01-26 ENCOUNTER — Telehealth: Payer: Self-pay

## 2015-01-26 NOTE — Telephone Encounter (Signed)
patient 's [name] was called in to [facility]  It will be mail out June 18.16 order # [id number]

## 2015-01-29 ENCOUNTER — Telehealth: Payer: Self-pay

## 2015-01-29 NOTE — Telephone Encounter (Signed)
Called patient to let him know that his caduet was here at the office and his next order has been order will ship in June 18.2016

## 2015-03-30 ENCOUNTER — Other Ambulatory Visit: Payer: Self-pay

## 2015-03-30 NOTE — Telephone Encounter (Signed)
Called patient to let him know that his caduet was here at the office for him to pick up

## 2015-04-16 ENCOUNTER — Ambulatory Visit (INDEPENDENT_AMBULATORY_CARE_PROVIDER_SITE_OTHER): Payer: Medicare Other | Admitting: Cardiology

## 2015-04-16 ENCOUNTER — Encounter: Payer: Self-pay | Admitting: *Deleted

## 2015-04-16 ENCOUNTER — Encounter: Payer: Self-pay | Admitting: Cardiology

## 2015-04-16 VITALS — BP 140/70 | HR 55 | Ht 71.0 in | Wt 236.8 lb

## 2015-04-16 DIAGNOSIS — I10 Essential (primary) hypertension: Secondary | ICD-10-CM

## 2015-04-16 DIAGNOSIS — T50905A Adverse effect of unspecified drugs, medicaments and biological substances, initial encounter: Secondary | ICD-10-CM

## 2015-04-16 DIAGNOSIS — G4733 Obstructive sleep apnea (adult) (pediatric): Secondary | ICD-10-CM

## 2015-04-16 DIAGNOSIS — I2510 Atherosclerotic heart disease of native coronary artery without angina pectoris: Secondary | ICD-10-CM | POA: Diagnosis not present

## 2015-04-16 DIAGNOSIS — R079 Chest pain, unspecified: Secondary | ICD-10-CM

## 2015-04-16 DIAGNOSIS — E785 Hyperlipidemia, unspecified: Secondary | ICD-10-CM

## 2015-04-16 DIAGNOSIS — R001 Bradycardia, unspecified: Secondary | ICD-10-CM

## 2015-04-16 LAB — LIPID PANEL
CHOL/HDL RATIO: 3
Cholesterol: 110 mg/dL (ref 0–200)
HDL: 35.9 mg/dL — ABNORMAL LOW (ref >39.00)
LDL CALC: 54 mg/dL (ref 0–99)
NonHDL: 74.1
Triglycerides: 101 mg/dL (ref 0.0–149.0)
VLDL: 20.2 mg/dL (ref 0.0–40.0)

## 2015-04-16 LAB — BASIC METABOLIC PANEL
BUN: 11 mg/dL (ref 6–23)
CO2: 32 meq/L (ref 19–32)
Calcium: 9.4 mg/dL (ref 8.4–10.5)
Chloride: 100 mEq/L (ref 96–112)
Creatinine, Ser: 0.76 mg/dL (ref 0.40–1.50)
GFR: 107.03 mL/min (ref >60.00)
Glucose, Bld: 154 mg/dL — ABNORMAL HIGH (ref 70–99)
Potassium: 3.4 mEq/L — ABNORMAL LOW (ref 3.5–5.1)
Sodium: 139 mEq/L (ref 135–145)

## 2015-04-16 LAB — HEPATIC FUNCTION PANEL
ALBUMIN: 4.2 g/dL (ref 3.5–5.2)
ALK PHOS: 77 U/L (ref 39–117)
ALT: 20 U/L (ref 0–53)
AST: 20 U/L (ref 0–37)
BILIRUBIN DIRECT: 0.2 mg/dL (ref 0.0–0.3)
BILIRUBIN TOTAL: 0.8 mg/dL (ref 0.2–1.2)
TOTAL PROTEIN: 7.4 g/dL (ref 6.0–8.3)

## 2015-04-16 NOTE — Progress Notes (Signed)
Cardiology Office Note   Date:  04/16/2015   ID:  Austin Mcgrath, DOB [birth year], MRN [id number]  PCP:  Lujean Amel, MD    Chief Complaint  Patient presents with  . Follow-up    OSA      History of Present Illness: [place] Mc[place]ath is a 74 y.o. male with a history of nonobstructive ASCAD, HTN, OSA and dyslipidemia who presents today for followup. He is doing well. He denies any LE edema, dizziness, palpitations or syncope. He has chronic SOB that is stable. He has some tightness in his chest when he gets SOB.  He uses the full face mask which he tolerates well. He feels the pressure is adequate. He is sleeping better at night without really waking up and feels rested in the am. He denies any daytime sleepiness.    Past Medical History  Diagnosis Date  . Arteriosclerotic coronary artery disease     nonobstructive/Mild AI -DR Radford Pax  . 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 Burnett Harry  . Hypertension   . Dyslipidemia     elevated cholesterol-Dr Radford Pax  . Aortic valve sclerosis     No etenosis Echo 2012 Mild TR  . TR (tricuspid regurgitation)     Mild  . Gout     Dr Justine Null  . OA (osteoarthritis)     Dr Justine Null  . BPH (benign prostatic hypertrophy)   . 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 Degenerative disc disease     Dr Cyndy Freeze  . Tubular adenoma of colon     colonoscopy 05/2011 repeat in 5 years -Dr. Watt Climes  . Macular degeneration     mild hypertensive retinopaty,macular degeneration,referred to retina   . Screening for AAA (aortic abdominal aneurysm) 02/2012    No aneurysm  . OSA (obstructive sleep apnea)     severe with AHI 43/hr now on BiPAP at 13/9cm H2O    Past Surgical History  Procedure Laterality Date  . L-disk l5-s1 (right)  1993  . Hernia repair  Age 58  . Foot sx  2006     Current Outpatient Prescriptions  Medication Sig Dispense Refill  . allopurinol (ZYLOPRIM) 300 MG tablet Take 300 mg by mouth daily.      Marland Kitchen amLODipine-atorvastatin ([name]) 10-80 MG per tablet Take 1 tablet by mouth daily. 90 tablet 3  . aspirin 81 MG tablet Take 81 mg by mouth daily.    . benazepril (LOTENSIN) 40 MG tablet Take 1 tablet (40 mg total) by mouth daily. 90 tablet 3  . hydrochlorothiazide (HYDRODIURIL) 25 MG tablet Take 1 tablet (25 mg total) by mouth daily. 90 tablet 3  . ibuprofen (ADVIL,MOTRIN) 200 MG tablet Take 400 mg by mouth as needed.    . Melatonin 3 MG CAPS Take 1 capsule by mouth at bedtime.    . Multiple Vitamins-Minerals (CENTRUM CARDIO PO) Take 1 tablet by mouth 2 (two) times daily.    . Multiple Vitamins-Minerals (ICAPS MV PO) Take 1 tablet by mouth 2 (two) times daily.    . Omega-3 Fatty Acids (FISH OIL) 1000 MG CAPS Take 4 capsules by mouth daily.    . pantoprazole (PROTONIX) 40 MG tablet Take 40 mg by mouth daily.    . potassium chloride SA (K-DUR,KLOR-CON) 20 MEQ tablet Take 20 mEq by mouth 2 (two) times daily.     No current facility-administered medications for  this visit.    Allergies:   Contrast media    Social History:  The patient  reports that he quit smoking about 43 years ago. He does not have any smokeless tobacco history on file. He reports that he does not drink alcohol or use illicit drugs.   Family History:  The patient's family history includes Lung cancer in his mother.    ROS:  Please see the history of present illness.   Otherwise, review of systems are positive for none.   All other systems are reviewed and negative.    PHYSICAL EXAM: VS:  BP 140/70 mmHg  Pulse 55  Ht 5\' 11"  (1.803 m)  Wt 236 lb 12.8 oz (107.412 kg)  BMI 33.04 kg/m2  SpO2 96% , BMI Body mass index is 33.04 kg/(m^2). GEN: Well nourished, well developed, in no acute distress HEENT: normal Neck: no JVD, carotid bruits, or masses Cardiac: RRR; no murmurs, rubs, or gallops,no edema, occasional ectopy Respiratory:  clear to auscultation bilaterally, normal work of breathing GI: soft, nontender,  nondistended, + BS MS: no deformity or atrophy Skin: warm and dry, no rash Neuro:  Strength and sensation are intact Psych: euthymic mood, full affect   EKG:  EKG is not ordered today.    Recent Labs: 10/12/2014: ALT 36    Lipid Panel    Component Value Date/Time   CHOL 124 10/12/2014 0938   CHOL 111 10/28/2013 0813   TRIG 139.0 10/12/2014 0938   TRIG 146 10/28/2013 0813   HDL 33.60* 10/12/2014 0938   HDL 37* 10/28/2013 0813   CHOLHDL 4 10/12/2014 0938   VLDL 27.8 10/12/2014 0938   LDLCALC 63 10/12/2014 0938   LDLCALC 45 10/28/2013 0813      Wt Readings from Last 3 Encounters:  04/16/15 236 lb 12.8 oz (107.412 kg)  10/12/14 245 lb 12.8 oz (111.494 kg)  04/24/14 232 lb (105.235 kg)    ASSESSMENT AND PLAN:  1. Nonobstructive ASCAD - he occasionally has some discomfort in his chest related to SOB so I will get a lexiscan myoview to rule out ischemia - continue ASA  2. HTN - well controlled - continue Benazepril/HCTZ/[name]   3. Dyslipidemia  - continue [name]  - check FLP and ALT  4. Bradycardia with some fatigue - improved after stopping BB  5. Severe OSA on BiPAP and tolerating well.His d/l showed an AHI of 6.5/hr on 13/9cm H2O and 93% compliance in using more than 4 hours nightly. He will continue on current settings   Current medicines are reviewed at length with the patient today.  The patient does not have concerns regarding medicines.  The following changes have been made:  no change  Labs/ tests ordered today: See above Assessment and Plan No orders of the defined types were placed in this encounter.     Disposition:   FU with me in 1 year  Signed, Sueanne Margarita, MD  04/16/2015 9:18 AM    [facility] [place], [place], [place]  [address] Phone: [phone]; Fax: [phone]

## 2015-04-16 NOTE — Patient Instructions (Signed)
Medication Instructions:  No changes today  Labwork: Lipid profile/liver profile/BMET today  Testing/Procedures: Your physician has requested that you have a lexiscan myoview. For further information please visit [email or web address]. Please follow instruction sheet, as given.    Follow-Up: Your physician wants you to follow-up in: 1 year with Dr Radford Pax. (July 2017).You will receive a reminder letter in the mail two months in advance. If you don't receive a letter, please call our office to schedule the follow-up appointment.     Thank you for choosing [facility]!!

## 2015-04-18 ENCOUNTER — Telehealth: Payer: Self-pay

## 2015-04-18 DIAGNOSIS — I10 Essential (primary) hypertension: Secondary | ICD-10-CM

## 2015-04-18 MED ORDER — POTASSIUM CHLORIDE CRYS ER 20 MEQ PO TBCR: 40.0000 meq | EXTENDED_RELEASE_TABLET | ORAL | Status: DC

## 2015-04-18 NOTE — Telephone Encounter (Signed)
-----   Message from Sueanne Margarita, MD sent at 04/18/2015  8:57 AM EDT ----- Increase to Kdur 20 meq 2 tabs in the am and 1 tab in the pm and recheck BMET in 1 week

## 2015-04-18 NOTE — Telephone Encounter (Signed)
Instructed patient to INCREASE KDUR to 20 meq 2 tablets in the AM and 20 meq 1 tablet in the PM. BMET scheduled for next Wednesday. Patient agrees with treatment plan.

## 2015-04-19 ENCOUNTER — Telehealth (HOSPITAL_COMMUNITY): Payer: Self-pay

## 2015-04-19 NOTE — Telephone Encounter (Signed)
Left message on voicemail in reference to upcoming appointment scheduled for 04-24-2015. Phone number given for a call back so details instructions can be given. [place] Mcgr[place] A   

## 2015-04-19 NOTE — Telephone Encounter (Signed)
Patient given detailed instructions per Myocardial Perfusion Study Information Sheet for test on -04-24-2015 at 0930. Patient Notified to arrive 15 minutes early, and that it is imperative to arrive on time for appointment to keep from having the test rescheduled. Patient verbalized understanding. Oletta Lamas, Kimari Coudriet A

## 2015-04-24 ENCOUNTER — Ambulatory Visit (HOSPITAL_COMMUNITY): Payer: Medicare Other | Attending: Cardiology

## 2015-04-24 ENCOUNTER — Other Ambulatory Visit (INDEPENDENT_AMBULATORY_CARE_PROVIDER_SITE_OTHER): Payer: Medicare Other

## 2015-04-24 DIAGNOSIS — R079 Chest pain, unspecified: Secondary | ICD-10-CM | POA: Diagnosis present

## 2015-04-24 DIAGNOSIS — E785 Hyperlipidemia, unspecified: Secondary | ICD-10-CM | POA: Insufficient documentation

## 2015-04-24 DIAGNOSIS — I10 Essential (primary) hypertension: Secondary | ICD-10-CM | POA: Diagnosis not present

## 2015-04-24 DIAGNOSIS — G4733 Obstructive sleep apnea (adult) (pediatric): Secondary | ICD-10-CM | POA: Insufficient documentation

## 2015-04-24 DIAGNOSIS — I2510 Atherosclerotic heart disease of native coronary artery without angina pectoris: Secondary | ICD-10-CM | POA: Diagnosis not present

## 2015-04-24 DIAGNOSIS — T50905A Adverse effect of unspecified drugs, medicaments and biological substances, initial encounter: Secondary | ICD-10-CM

## 2015-04-24 DIAGNOSIS — R001 Bradycardia, unspecified: Secondary | ICD-10-CM

## 2015-04-24 LAB — MYOCARDIAL PERFUSION IMAGING
CHL CUP NUCLEAR SSS: 2
LHR: 0.28
LV dias vol: 151 mL
LV sys vol: 78 mL
Peak HR: 107 {beats}/min
Rest HR: 64 {beats}/min
SDS: 0
SRS: 2
TID: 0.94

## 2015-04-24 LAB — BASIC METABOLIC PANEL
BUN: 14 mg/dL (ref 6–23)
CO2: 31 mEq/L (ref 19–32)
Calcium: 10 mg/dL (ref 8.4–10.5)
Chloride: 100 mEq/L (ref 96–112)
Creatinine, Ser: 0.71 mg/dL (ref 0.40–1.50)
GFR: 115.77 mL/min (ref >60.00)
Glucose, Bld: 155 mg/dL — ABNORMAL HIGH (ref 70–99)
POTASSIUM: 3.5 meq/L (ref 3.5–5.1)
SODIUM: 139 meq/L (ref 135–145)

## 2015-04-24 MED ORDER — REGADENOSON 0.4 MG/5ML IV SOLN
0.4000 mg | Freq: Once | INTRAVENOUS | Status: AC
  Administered 2015-04-24: 0.4 mg via INTRAVENOUS

## 2015-04-24 MED ORDER — TECHNETIUM TC 99M SESTAMIBI GENERIC - CARDIOLITE
10.9000 | Freq: Once | INTRAVENOUS | Status: AC | PRN
  Administered 2015-04-24: 10.9 via INTRAVENOUS

## 2015-04-24 MED ORDER — TECHNETIUM TC 99M SESTAMIBI GENERIC - CARDIOLITE
32.9000 | Freq: Once | INTRAVENOUS | Status: AC | PRN
  Administered 2015-04-24: 32.9 via INTRAVENOUS

## 2015-04-25 ENCOUNTER — Telehealth: Payer: Self-pay

## 2015-04-25 ENCOUNTER — Other Ambulatory Visit: Payer: Self-pay | Admitting: Cardiology

## 2015-04-25 ENCOUNTER — Other Ambulatory Visit: Payer: No Typology Code available for payment source

## 2015-04-25 DIAGNOSIS — Z01812 Encounter for preprocedural laboratory examination: Secondary | ICD-10-CM

## 2015-04-25 DIAGNOSIS — R079 Chest pain, unspecified: Secondary | ICD-10-CM

## 2015-04-25 DIAGNOSIS — R9439 Abnormal result of other cardiovascular function study: Secondary | ICD-10-CM

## 2015-04-25 MED ORDER — SODIUM CHLORIDE 0.9 % IV SOLN: 20.0000 mg | INTRAVENOUS | Status: AC

## 2015-04-25 MED ORDER — POTASSIUM CHLORIDE CRYS ER 20 MEQ PO TBCR: 40.0000 meq | EXTENDED_RELEASE_TABLET | Freq: Two times a day (BID) | ORAL | Status: DC

## 2015-04-25 MED ORDER — PREDNISONE 20 MG PO TABS: 60.0000 mg | ORAL_TABLET | ORAL | Status: DC

## 2015-04-25 MED ORDER — DIPHENHYDRAMINE HCL 25 MG PO CAPS: 25.0000 mg | ORAL_CAPSULE | ORAL | Status: AC

## 2015-04-25 MED ORDER — PREDNISONE 20 MG PO TABS: ORAL_TABLET | ORAL | Status: DC

## 2015-04-25 NOTE — Telephone Encounter (Addendum)
-----   Message from Sueanne Margarita, MD sent at 04/25/2015  2:52 PM EDT ----- Patient has history of 30-45% RCA by cath 2009.  Now with increased DOE and CP.  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  Please set up for left heart cath.  Message     Patient in new atrial flutter at time of stress test. He also need cath for abnormal stress test. Please have flex see tomorrow to set up cath on Friday as well as discuss adding NOAC after cath.        Traci TUrner

## 2015-04-25 NOTE — Telephone Encounter (Signed)
-----   Message from Sueanne Margarita, MD sent at 04/25/2015 12:57 PM EDT ----- Increase Kdur to 56meq BID and recheck BMET in 1 week

## 2015-04-25 NOTE — Telephone Encounter (Signed)
Informed patient of results and verbal understanding expressed.  Instructed patient to INCREASE KDUR to 40 meq BID. Will recheck BMET next week.

## 2015-04-25 NOTE — Telephone Encounter (Signed)
Informed patient of results and verbal understanding expressed.  Patient scheduled for L heart cath Friday, 7/22 with Dr. Irish Lack at 1330. Patient to come tomorrow, 7/21, for pre-cath labs and to pick up instruction letter.  Per Dr. Radford Pax, instructed patient to take 60 mg Prednisone 18 hours before test and 1 hour before test as well. Patient agrees with treatment plan.

## 2015-04-26 ENCOUNTER — Telehealth: Payer: Self-pay | Admitting: Cardiology

## 2015-04-26 ENCOUNTER — Other Ambulatory Visit (INDEPENDENT_AMBULATORY_CARE_PROVIDER_SITE_OTHER): Payer: Medicare Other | Admitting: *Deleted

## 2015-04-26 ENCOUNTER — Encounter: Payer: Self-pay | Admitting: Cardiology

## 2015-04-26 DIAGNOSIS — R9439 Abnormal result of other cardiovascular function study: Secondary | ICD-10-CM

## 2015-04-26 DIAGNOSIS — I10 Essential (primary) hypertension: Secondary | ICD-10-CM

## 2015-04-26 DIAGNOSIS — Z01812 Encounter for preprocedural laboratory examination: Secondary | ICD-10-CM

## 2015-04-26 DIAGNOSIS — Z5181 Encounter for therapeutic drug level monitoring: Secondary | ICD-10-CM

## 2015-04-26 DIAGNOSIS — R931 Abnormal findings on diagnostic imaging of heart and coronary circulation: Secondary | ICD-10-CM | POA: Diagnosis not present

## 2015-04-26 DIAGNOSIS — R899 Unspecified abnormal finding in specimens from other organs, systems and tissues: Secondary | ICD-10-CM

## 2015-04-26 LAB — BASIC METABOLIC PANEL
BUN: 22 mg/dL (ref 6–23)
CALCIUM: 9.7 mg/dL (ref 8.4–10.5)
CO2: 28 meq/L (ref 19–32)
Chloride: 101 mEq/L (ref 96–112)
Creatinine, Ser: 0.92 mg/dL (ref 0.40–1.50)
GFR: 85.85 mL/min (ref >60.00)
GLUCOSE: 244 mg/dL — AB (ref 70–99)
Potassium: 4.3 mEq/L (ref 3.5–5.1)
Sodium: 137 mEq/L (ref 135–145)

## 2015-04-26 LAB — CBC WITH DIFFERENTIAL/PLATELET
BASOS ABS: 0 10*3/uL (ref 0.0–0.1)
Basophils Relative: 0 % (ref 0.0–3.0)
EOS PCT: 0 % (ref 0.0–5.0)
Eosinophils Absolute: 0 10*3/uL (ref 0.0–0.7)
HCT: 46.4 % (ref 39.0–52.0)
HEMOGLOBIN: 15.7 g/dL (ref 13.0–17.0)
LYMPHS ABS: 1.1 10*3/uL (ref 0.7–4.0)
Lymphocytes Relative: 9.7 % — ABNORMAL LOW (ref 12.0–46.0)
MCHC: 33.8 g/dL (ref 30.0–36.0)
MCV: 85.2 fl (ref 78.0–100.0)
Monocytes Absolute: 0.2 10*3/uL (ref 0.1–1.0)
Monocytes Relative: 1.5 % — ABNORMAL LOW (ref 3.0–12.0)
NEUTROS ABS: 10 10*3/uL — AB (ref 1.4–7.7)
Neutrophils Relative %: 88.8 % — ABNORMAL HIGH (ref 43.0–77.0)
PLATELETS: 220 10*3/uL (ref 150.0–400.0)
RBC: 5.45 Mil/uL (ref 4.22–5.81)
RDW: 14.4 % (ref 11.5–15.5)
WBC: 11.3 10*3/uL — AB (ref 4.0–10.5)

## 2015-04-26 LAB — PROTIME-INR
INR: 1.1 ratio — ABNORMAL HIGH (ref 0.8–1.0)
PROTHROMBIN TIME: 12.6 s (ref 9.6–13.1)

## 2015-04-26 LAB — APTT: APTT: 23.2 s — AB (ref 23.4–32.7)

## 2015-04-26 NOTE — Telephone Encounter (Signed)
Please have patient come in to repeat CBC given abnormal Burr cells noted on smear

## 2015-04-26 NOTE — Addendum Note (Signed)
Addended by: Harland German A on: 04/26/2015 04:30 PM   Modules accepted: Orders

## 2015-04-26 NOTE — Telephone Encounter (Signed)
Patient to have repeat blood work on Monday at follow-up visit.

## 2015-04-27 ENCOUNTER — Other Ambulatory Visit: Payer: Self-pay

## 2015-04-27 ENCOUNTER — Ambulatory Visit (HOSPITAL_COMMUNITY)
Admission: RE | Admit: 2015-04-27 | Discharge: 2015-04-27 | Disposition: A | Discharge status: Home / Self Care | Payer: Medicare Other | Source: Ambulatory Visit | Attending: Interventional Cardiology | Admitting: Interventional Cardiology

## 2015-04-27 ENCOUNTER — Encounter (HOSPITAL_COMMUNITY)
Admission: RE | Disposition: A | Discharge status: Home / Self Care | Payer: Self-pay | Source: Ambulatory Visit | Attending: Interventional Cardiology

## 2015-04-27 DIAGNOSIS — R9439 Abnormal result of other cardiovascular function study: Secondary | ICD-10-CM | POA: Insufficient documentation

## 2015-04-27 DIAGNOSIS — Z87891 Personal history of nicotine dependence: Secondary | ICD-10-CM | POA: Insufficient documentation

## 2015-04-27 DIAGNOSIS — M109 Gout, unspecified: Secondary | ICD-10-CM | POA: Diagnosis not present

## 2015-04-27 DIAGNOSIS — R931 Abnormal findings on diagnostic imaging of heart and coronary circulation: Secondary | ICD-10-CM | POA: Insufficient documentation

## 2015-04-27 DIAGNOSIS — E785 Hyperlipidemia, unspecified: Secondary | ICD-10-CM | POA: Insufficient documentation

## 2015-04-27 DIAGNOSIS — R079 Chest pain, unspecified: Secondary | ICD-10-CM | POA: Insufficient documentation

## 2015-04-27 DIAGNOSIS — E780 Pure hypercholesterolemia: Secondary | ICD-10-CM | POA: Diagnosis not present

## 2015-04-27 DIAGNOSIS — Z7982 Long term (current) use of aspirin: Secondary | ICD-10-CM | POA: Diagnosis not present

## 2015-04-27 DIAGNOSIS — Z79899 Other long term (current) drug therapy: Secondary | ICD-10-CM | POA: Diagnosis not present

## 2015-04-27 DIAGNOSIS — I2510 Atherosclerotic heart disease of native coronary artery without angina pectoris: Secondary | ICD-10-CM | POA: Diagnosis not present

## 2015-04-27 DIAGNOSIS — Z791 Long term (current) use of non-steroidal anti-inflammatories (NSAID): Secondary | ICD-10-CM | POA: Insufficient documentation

## 2015-04-27 DIAGNOSIS — I10 Essential (primary) hypertension: Secondary | ICD-10-CM | POA: Diagnosis not present

## 2015-04-27 DIAGNOSIS — G4733 Obstructive sleep apnea (adult) (pediatric): Secondary | ICD-10-CM | POA: Insufficient documentation

## 2015-04-27 DIAGNOSIS — E119 Type 2 diabetes mellitus without complications: Secondary | ICD-10-CM | POA: Insufficient documentation

## 2015-04-27 HISTORY — PX: CARDIAC CATHETERIZATION: SHX172

## 2015-04-27 SURGERY — LEFT HEART CATH AND CORONARY ANGIOGRAPHY

## 2015-04-27 MED ORDER — ASPIRIN 81 MG PO CHEW
CHEWABLE_TABLET | ORAL | Status: AC
  Administered 2015-04-27: 81 mg via ORAL
  Filled 2015-04-27: qty 1

## 2015-04-27 MED ORDER — FENTANYL CITRATE (PF) 100 MCG/2ML IJ SOLN
INTRAMUSCULAR | Status: DC | PRN
  Administered 2015-04-27: 25 ug via INTRAVENOUS

## 2015-04-27 MED ORDER — SODIUM CHLORIDE 0.9 % IJ SOLN: 3.0000 mL | INTRAMUSCULAR | Status: DC | PRN

## 2015-04-27 MED ORDER — NITROGLYCERIN 1 MG/10 ML FOR IR/CATH LAB
INTRA_ARTERIAL | Status: AC
  Filled 2015-04-27: qty 10

## 2015-04-27 MED ORDER — HEPARIN (PORCINE) IN NACL 2-0.9 UNIT/ML-% IJ SOLN
INTRAMUSCULAR | Status: DC | PRN
  Administered 2015-04-27: 16:00:00

## 2015-04-27 MED ORDER — FAMOTIDINE IN NACL 20-0.9 MG/50ML-% IV SOLN
INTRAVENOUS | Status: AC
  Administered 2015-04-27: 20 mg via INTRAVENOUS
  Filled 2015-04-27: qty 50

## 2015-04-27 MED ORDER — DIPHENHYDRAMINE HCL 25 MG PO CAPS
ORAL_CAPSULE | ORAL | Status: AC
  Filled 2015-04-27: qty 1

## 2015-04-27 MED ORDER — ASPIRIN 81 MG PO CHEW
81.0000 mg | CHEWABLE_TABLET | ORAL | Status: AC
  Administered 2015-04-27: 81 mg via ORAL

## 2015-04-27 MED ORDER — SODIUM CHLORIDE 0.9 % WEIGHT BASED INFUSION: 1.0000 mL/kg/h | INTRAVENOUS | Status: DC

## 2015-04-27 MED ORDER — FAMOTIDINE IN NACL 20-0.9 MG/50ML-% IV SOLN
20.0000 mg | Freq: Once | INTRAVENOUS | Status: AC
  Administered 2015-04-27: 20 mg via INTRAVENOUS

## 2015-04-27 MED ORDER — SODIUM CHLORIDE 0.9 % IJ SOLN
INTRAMUSCULAR | Status: DC | PRN
  Administered 2015-04-27: 16:00:00 via INTRA_ARTERIAL

## 2015-04-27 MED ORDER — SODIUM CHLORIDE 0.9 % IV SOLN: 250.0000 mL | INTRAVENOUS | Status: DC | PRN

## 2015-04-27 MED ORDER — HEPARIN (PORCINE) IN NACL 2-0.9 UNIT/ML-% IJ SOLN
INTRAMUSCULAR | Status: AC
  Filled 2015-04-27: qty 1000

## 2015-04-27 MED ORDER — FENTANYL CITRATE (PF) 100 MCG/2ML IJ SOLN
INTRAMUSCULAR | Status: AC
  Filled 2015-04-27: qty 2

## 2015-04-27 MED ORDER — MIDAZOLAM HCL 2 MG/2ML IJ SOLN
INTRAMUSCULAR | Status: AC
  Filled 2015-04-27: qty 2

## 2015-04-27 MED ORDER — SODIUM CHLORIDE 0.9 % WEIGHT BASED INFUSION
3.0000 mL/kg/h | INTRAVENOUS | Status: DC
  Administered 2015-04-27: 3 mL/kg/h via INTRAVENOUS

## 2015-04-27 MED ORDER — VERAPAMIL HCL 2.5 MG/ML IV SOLN
INTRAVENOUS | Status: AC
  Filled 2015-04-27: qty 2

## 2015-04-27 MED ORDER — HEPARIN SODIUM (PORCINE) 1000 UNIT/ML IJ SOLN
INTRAMUSCULAR | Status: DC | PRN
  Administered 2015-04-27: 5000 [IU] via INTRAVENOUS

## 2015-04-27 MED ORDER — SODIUM CHLORIDE 0.9 % WEIGHT BASED INFUSION
1.0000 mL/kg/h | INTRAVENOUS | Status: DC
  Administered 2015-04-27: 1 mL/kg/h via INTRAVENOUS

## 2015-04-27 MED ORDER — SODIUM CHLORIDE 0.9 % IJ SOLN: 3.0000 mL | Freq: Two times a day (BID) | INTRAMUSCULAR | Status: DC

## 2015-04-27 MED ORDER — DIPHENHYDRAMINE HCL 50 MG/ML IJ SOLN
INTRAMUSCULAR | Status: AC
  Administered 2015-04-27: 25 mg via INTRAVENOUS
  Filled 2015-04-27: qty 1

## 2015-04-27 MED ORDER — MIDAZOLAM HCL 2 MG/2ML IJ SOLN
INTRAMUSCULAR | Status: DC | PRN
  Administered 2015-04-27: 2 mg via INTRAVENOUS

## 2015-04-27 MED ORDER — DIPHENHYDRAMINE HCL 50 MG/ML IJ SOLN
25.0000 mg | Freq: Once | INTRAMUSCULAR | Status: AC
  Administered 2015-04-27: 25 mg via INTRAVENOUS

## 2015-04-27 MED ORDER — LIDOCAINE HCL (PF) 1 % IJ SOLN
INTRAMUSCULAR | Status: AC
  Filled 2015-04-27: qty 30

## 2015-04-27 MED ORDER — IOHEXOL 350 MG/ML SOLN
INTRAVENOUS | Status: DC | PRN
  Administered 2015-04-27: 80 mL via INTRACARDIAC

## 2015-04-27 SURGICAL SUPPLY — 12 items
CATH INFINITI 5 FR JL3.5 (CATHETERS) ×3 IMPLANT
CATH INFINITI 5FR ANG PIGTAIL (CATHETERS) ×6 IMPLANT
CATH INFINITI JR4 5F (CATHETERS) ×3 IMPLANT
DEVICE RAD COMP TR BAND LRG (VASCULAR PRODUCTS) ×3 IMPLANT
GLIDESHEATH SLEND SS 6F .021 (SHEATH) ×3 IMPLANT
KIT HEART LEFT (KITS) ×3 IMPLANT
PACK CARDIAC CATHETERIZATION (CUSTOM PROCEDURE TRAY) ×3 IMPLANT
SYR MEDRAD MARK V 150ML (SYRINGE) ×3 IMPLANT
TRANSDUCER W/STOPCOCK (MISCELLANEOUS) ×3 IMPLANT
TUBING CIL FLEX 10 FLL-RA (TUBING) ×3 IMPLANT
WIRE HI TORQ VERSACORE-J 145CM (WIRE) ×3 IMPLANT
WIRE SAFE-T 1.5MM-J .035X260CM (WIRE) ×3 IMPLANT

## 2015-04-27 NOTE — H&P (View-Only) (Signed)
Cardiology Office Note   Date:  04/16/2015   ID:  ELLIJAH LEFFEL, DOB 1940-12-18, MRN [id number]  PCP:  Lujean Amel, MD    Chief Complaint  Patient presents with  . Follow-up    OSA      History of Present Illness: [place] Mcgrath is a 74 y.o. male with a history of nonobstructive ASCAD, HTN, OSA and dyslipidemia who presents today for followup. He is doing well. He denies any LE edema, dizziness, palpitations or syncope. He has chronic SOB that is stable. He has some tightness in his chest when he gets SOB.  He uses the full face mask which he tolerates well. He feels the pressure is adequate. He is sleeping better at night without really waking up and feels rested in the am. He denies any daytime sleepiness.    Past Medical History  Diagnosis Date  . Arteriosclerotic coronary artery disease     nonobstructive/Mild AI -DR Radford Pax  . 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 Burnett Harry  . Hypertension   . Dyslipidemia     elevated cholesterol-Dr Radford Pax  . Aortic valve sclerosis     No etenosis Echo 2012 Mild TR  . TR (tricuspid regurgitation)     Mild  . Gout     Dr Justine Null  . OA (osteoarthritis)     Dr Justine Null  . BPH (benign prostatic hypertrophy)   . 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 Degenerative disc disease     Dr Cyndy Freeze  . Tubular adenoma of colon     colonoscopy 05/2011 repeat in 5 years -Dr. Watt Climes  . Macular degeneration     mild hypertensive retinopaty,macular degeneration,referred to retina   . Screening for AAA (aortic abdominal aneurysm) 02/2012    No aneurysm  . OSA (obstructive sleep apnea)     severe with AHI 43/hr now on BiPAP at 13/9cm H2O    Past Surgical History  Procedure Laterality Date  . L-disk l5-s1 (right)  1993  . Hernia repair  Age 25  . Foot sx  2006     Current Outpatient Prescriptions  Medication Sig Dispense Refill  . allopurinol (ZYLOPRIM) 300 MG tablet Take 300 mg by mouth daily.      Marland Kitchen amLODipine-atorvastatin ([name]) 10-80 MG per tablet Take 1 tablet by mouth daily. 90 tablet 3  . aspirin 81 MG tablet Take 81 mg by mouth daily.    . benazepril (LOTENSIN) 40 MG tablet Take 1 tablet (40 mg total) by mouth daily. 90 tablet 3  . hydrochlorothiazide (HYDRODIURIL) 25 MG tablet Take 1 tablet (25 mg total) by mouth daily. 90 tablet 3  . ibuprofen (ADVIL,MOTRIN) 200 MG tablet Take 400 mg by mouth as needed.    . Melatonin 3 MG CAPS Take 1 capsule by mouth at bedtime.    . Multiple Vitamins-Minerals (CENTRUM CARDIO PO) Take 1 tablet by mouth 2 (two) times daily.    . Multiple Vitamins-Minerals (ICAPS MV PO) Take 1 tablet by mouth 2 (two) times daily.    . Omega-3 Fatty Acids (FISH OIL) 1000 MG CAPS Take 4 capsules by mouth daily.    . pantoprazole (PROTONIX) 40 MG tablet Take 40 mg by mouth daily.    . potassium chloride SA (K-DUR,KLOR-CON) 20 MEQ tablet Take 20 mEq by mouth 2 (two) times daily.     No current facility-administered medications for  this visit.    Allergies:   Contrast media    Social History:  The patient  reports that he quit smoking about 43 years ago. He does not have any smokeless tobacco history on file. He reports that he does not drink alcohol or use illicit drugs.   Family History:  The patient's family history includes Lung cancer in his mother.    ROS:  Please see the history of present illness.   Otherwise, review of systems are positive for none.   All other systems are reviewed and negative.    PHYSICAL EXAM: VS:  BP 140/70 mmHg  Pulse 55  Ht 5\' 11"  (1.803 m)  Wt 236 lb 12.8 oz (107.412 kg)  BMI 33.04 kg/m2  SpO2 96% , BMI Body mass index is 33.04 kg/(m^2). GEN: Well nourished, well developed, in no acute distress HEENT: normal Neck: no JVD, carotid bruits, or masses Cardiac: RRR; no murmurs, rubs, or gallops,no edema, occasional ectopy Respiratory:  clear to auscultation bilaterally, normal work of breathing GI: soft, nontender,  nondistended, + BS MS: no deformity or atrophy Skin: warm and dry, no rash Neuro:  Strength and sensation are intact Psych: euthymic mood, full affect   EKG:  EKG is not ordered today.    Recent Labs: 10/12/2014: ALT 36    Lipid Panel    Component Value Date/Time   CHOL 124 10/12/2014 0938   CHOL 111 10/28/2013 0813   TRIG 139.0 10/12/2014 0938   TRIG 146 10/28/2013 0813   HDL 33.60* 10/12/2014 0938   HDL 37* 10/28/2013 0813   CHOLHDL 4 10/12/2014 0938   VLDL 27.8 10/12/2014 0938   LDLCALC 63 10/12/2014 0938   LDLCALC 45 10/28/2013 0813      Wt Readings from Last 3 Encounters:  04/16/15 236 lb 12.8 oz (107.412 kg)  10/12/14 245 lb 12.8 oz (111.494 kg)  04/24/14 232 lb (105.235 kg)    ASSESSMENT AND PLAN:  1. Nonobstructive ASCAD - he occasionally has some discomfort in his chest related to SOB so I will get a lexiscan myoview to rule out ischemia - continue ASA  2. HTN - well controlled - continue Benazepril/HCTZ/[name]   3. Dyslipidemia  - continue [name]  - check FLP and ALT  4. Bradycardia with some fatigue - improved after stopping BB  5. Severe OSA on BiPAP and tolerating well.His d/l showed an AHI of 6.5/hr on 13/9cm H2O and 93% compliance in using more than 4 hours nightly. He will continue on current settings   Current medicines are reviewed at length with the patient today.  The patient does not have concerns regarding medicines.  The following changes have been made:  no change  Labs/ tests ordered today: See above Assessment and Plan No orders of the defined types were placed in this encounter.     Disposition:   FU with me in 1 year  Signed, Sueanne Margarita, MD  04/16/2015 9:18 AM    [facility] [place], [place], [place]  [address] Phone: [phone]; Fax: [phone]

## 2015-04-27 NOTE — Interval H&P Note (Signed)
Cath Lab Visit (complete for each Cath Lab visit)  Clinical Evaluation Leading to the Procedure:   ACS: No.  Non-ACS:    Anginal Classification: CCS III  Anti-ischemic medical therapy: Minimal Therapy (1 class of medications)  Non-Invasive Test Results: Low-risk stress test findings: cardiac mortality <1%/year  Prior CABG: No previous CABG  Ischemic Symptoms? CCS III (Marked limitation of ordinary activity) Anti-ischemic Medical Therapy? Minimal Therapy (1 class of medications) Non-invasive Test Results? Low-risk stress test findings: cardiac mortality <1%/year Prior CABG? No Previous CABG   Patient Information:   1-2V CAD, no prox LAD  U (5)  Indication: 14; Score: 5   Patient Information:   CTO of 1 vessel, no other CAD  I (3)  Indication: 24; Score: 3   Patient Information:   1V CAD with prox LAD  A (7)  Indication: 30; Score: 7   Patient Information:   2V-CAD with prox LAD  A (7)  Indication: 36; Score: 7   Patient Information:   3V-CAD without LMCA  A (7)  Indication: 42; Score: 7   Patient Information:   3V-CAD without LMCA With Abnormal LV systolic function  A (9)  Indication: 48; Score: 9   Patient Information:   LMCA-CAD  A (9)  Indication: 49; Score: 9   Patient Information:   2V-CAD with prox LAD PCI  A (7)  Indication: 62; Score: 7   Patient Information:   2V-CAD with prox LAD CABG  A (8)  Indication: 62; Score: 8   Patient Information:   3V-CAD without LMCA With Low CAD burden(i.e., 3 focal stenoses, low SYNTAX score) PCI  A (7)  Indication: 63; Score: 7   Patient Information:   3V-CAD without LMCA With Low CAD burden(i.e., 3 focal stenoses, low SYNTAX score) CABG  A (9)  Indication: 63; Score: 9   Patient Information:   3V-CAD without LMCA E06c - Intermediate-high CAD burden (i.e., multiple diffuse lesions, presence of CTO, or high SYNTAX score) PCI  U (4)  Indication: 64; Score:  4   Patient Information:   3V-CAD without LMCA E06c - Intermediate-high CAD burden (i.e., multiple diffuse lesions, presence of CTO, or high SYNTAX score) CABG  A (9)  Indication: 64; Score: 9   Patient Information:   LMCA-CAD With Isolated LMCA stenosis  PCI  U (6)  Indication: 65; Score: 6   Patient Information:   LMCA-CAD With Isolated LMCA stenosis  CABG  A (9)  Indication: 65; Score: 9   Patient Information:   LMCA-CAD Additional CAD, low CAD burden (i.e., 1- to 2-vessel additional involvement, low SYNTAX score) PCI  U (5)  Indication: 66; Score: 5   Patient Information:   LMCA-CAD Additional CAD, low CAD burden (i.e., 1- to 2-vessel additional involvement, low SYNTAX score) CABG  A (9)  Indication: 66; Score: 9   Patient Information:   LMCA-CAD Additional CAD, intermediate-high CAD burden (i.e., 3-vessel involvement, presence of CTO, or high SYNTAX score) PCI  I (3)  Indication: 67; Score: 3   Patient Information:   LMCA-CAD Additional CAD, intermediate-high CAD burden (i.e., 3-vessel involvement, presence of CTO, or high SYNTAX score) CABG  A (9)  Indication: 67; Score: 9     History and Physical Interval Note:  04/27/2015 3:39 PM  Austin Mcgrath  has presented today for surgery, with the diagnosis of abnormal nuc  The various methods of treatment have been discussed with the patient and family. After consideration of risks, benefits and other options for  treatment, the patient has consented to  Procedure(s): Left Heart Cath and Coronary Angiography (N/A) as a surgical intervention .  The patient's history has been reviewed, patient examined, no change in status, stable for surgery.  I have reviewed the patient's chart and labs.  Questions were answered to the patient's satisfaction.     Lurdes Haltiwanger S.

## 2015-04-27 NOTE — Discharge Instructions (Signed)
Radial Site Care °Refer to this sheet in the next few weeks. These instructions provide you with information on caring for yourself after your procedure. Your caregiver may also give you more specific instructions. Your treatment has been planned according to current medical practices, but problems sometimes occur. Call your caregiver if you have any problems or questions after your procedure. °HOME CARE INSTRUCTIONS °· You may shower the day after the procedure. Remove the bandage (dressing) and gently wash the site with plain soap and water. Gently pat the site dry. °· Do not apply powder or lotion to the site. °· Do not submerge the affected site in water for 3 to 5 days. °· Inspect the site at least twice daily. °· Do not flex or bend the affected arm for 24 hours. °· No lifting over 5 pounds (2.3 kg) for 5 days after your procedure. °· Do not drive home if you are discharged the same day of the procedure. Have someone else drive you. °· You may drive 24 hours after the procedure unless otherwise instructed by your caregiver. °· Do not operate machinery or power tools for 24 hours. °· A responsible adult should be with you for the first 24 hours after you arrive home. °What to expect: °· Any bruising will usually fade within 1 to 2 weeks. °· Blood that collects in the tissue (hematoma) may be painful to the touch. It should usually decrease in size and tenderness within 1 to 2 weeks. °SEEK IMMEDIATE MEDICAL CARE IF: °· You have unusual pain at the radial site. °· You have redness, warmth, swelling, or pain at the radial site. °· You have drainage (other than a small amount of blood on the dressing). °· You have chills. °· You have a fever or persistent symptoms for more than 72 hours. °· You have a fever and your symptoms suddenly get worse. °· Your arm becomes pale, cool, tingly, or numb. °· You have heavy bleeding from the site. Hold pressure on the site. °Document Released: 10/25/2010 Document Revised:  12/15/2011 Document Reviewed: 10/25/2010 °ExitCare® Patient Information ©2015 [employer]. This information is not intended to replace advice given to you by your health care provider. Make sure you discuss any questions you have with your health care provider. ° °

## 2015-04-30 ENCOUNTER — Telehealth: Payer: Self-pay | Admitting: Cardiology

## 2015-04-30 ENCOUNTER — Other Ambulatory Visit: Payer: Self-pay | Admitting: Cardiology

## 2015-04-30 ENCOUNTER — Telehealth: Payer: Self-pay

## 2015-04-30 ENCOUNTER — Other Ambulatory Visit (INDEPENDENT_AMBULATORY_CARE_PROVIDER_SITE_OTHER): Payer: Medicare Other

## 2015-04-30 ENCOUNTER — Other Ambulatory Visit: Payer: Self-pay

## 2015-04-30 ENCOUNTER — Encounter: Payer: Self-pay | Admitting: Cardiology

## 2015-04-30 ENCOUNTER — Encounter (HOSPITAL_COMMUNITY): Payer: Self-pay | Admitting: Interventional Cardiology

## 2015-04-30 ENCOUNTER — Ambulatory Visit (INDEPENDENT_AMBULATORY_CARE_PROVIDER_SITE_OTHER): Payer: Medicare Other | Admitting: Cardiology

## 2015-04-30 VITALS — BP 145/80 | HR 54 | Ht 71.0 in | Wt 235.0 lb

## 2015-04-30 DIAGNOSIS — E785 Hyperlipidemia, unspecified: Secondary | ICD-10-CM

## 2015-04-30 DIAGNOSIS — I4892 Unspecified atrial flutter: Secondary | ICD-10-CM

## 2015-04-30 DIAGNOSIS — I10 Essential (primary) hypertension: Secondary | ICD-10-CM

## 2015-04-30 DIAGNOSIS — R001 Bradycardia, unspecified: Secondary | ICD-10-CM

## 2015-04-30 DIAGNOSIS — G4733 Obstructive sleep apnea (adult) (pediatric): Secondary | ICD-10-CM

## 2015-04-30 DIAGNOSIS — R899 Unspecified abnormal finding in specimens from other organs, systems and tissues: Secondary | ICD-10-CM

## 2015-04-30 DIAGNOSIS — I2510 Atherosclerotic heart disease of native coronary artery without angina pectoris: Secondary | ICD-10-CM

## 2015-04-30 DIAGNOSIS — T50905A Adverse effect of unspecified drugs, medicaments and biological substances, initial encounter: Secondary | ICD-10-CM

## 2015-04-30 LAB — CBC WITH DIFFERENTIAL/PLATELET
Basophils Absolute: 0.1 10*3/uL (ref 0.0–0.1)
Basophils Relative: 0.5 % (ref 0.0–3.0)
EOS PCT: 1.3 % (ref 0.0–5.0)
Eosinophils Absolute: 0.1 10*3/uL (ref 0.0–0.7)
HEMATOCRIT: 47.1 % (ref 39.0–52.0)
HEMOGLOBIN: 15.6 g/dL (ref 13.0–17.0)
LYMPHS ABS: 1.9 10*3/uL (ref 0.7–4.0)
Lymphocytes Relative: 18.6 % (ref 12.0–46.0)
MCHC: 33.1 g/dL (ref 30.0–36.0)
MCV: 84.8 fl (ref 78.0–100.0)
Monocytes Absolute: 0.8 10*3/uL (ref 0.1–1.0)
Monocytes Relative: 8.1 % (ref 3.0–12.0)
Neutro Abs: 7.4 10*3/uL (ref 1.4–7.7)
Neutrophils Relative %: 71.5 % (ref 43.0–77.0)
PLATELETS: 227 10*3/uL (ref 150.0–400.0)
RBC: 5.56 Mil/uL (ref 4.22–5.81)
RDW: 14.4 % (ref 11.5–15.5)
WBC: 10.4 10*3/uL (ref 4.0–10.5)

## 2015-04-30 MED ORDER — POTASSIUM CHLORIDE CRYS ER 20 MEQ PO TBCR: 40.0000 meq | EXTENDED_RELEASE_TABLET | Freq: Two times a day (BID) | ORAL | Status: DC

## 2015-04-30 MED ORDER — APIXABAN 5 MG PO TABS: 5.0000 mg | ORAL_TABLET | Freq: Two times a day (BID) | ORAL | Status: DC

## 2015-04-30 NOTE — Patient Instructions (Signed)
Medication Instructions:  Your physician recommends that you continue on your current medications as directed. Please refer to the Current Medication list given to you today.  Try Pepcid OTC for the chest discomfort you have been having  Labwork: Bmet, Cbc today   Testing/Procedures: None   Follow-Up: Your physician wants you to follow-up in: 1 year with Dr.Turner You will receive a reminder letter in the mail two months in advance. If you don't receive a letter, please call our office to schedule the follow-up appointment.    Any Other Special Instructions Will Be Listed Below (If Applicable).

## 2015-04-30 NOTE — Telephone Encounter (Signed)
I called pt and reviewed reason for eliquis and to stop ASA -he understood.

## 2015-04-30 NOTE — Telephone Encounter (Signed)
Original authorizing provider: Sueanne Margarita, MD         Austin Mcgrath would like a refill of the following medications:    potassium chloride SA (K-DUR,KLOR-CON) 20 MEQ tablet Sueanne Margarita, MD]        Preferred pharmacy: [facility], [place] - [id number]        Comment:

## 2015-04-30 NOTE — Progress Notes (Addendum)
Cardiology Office Note   Date:  04/30/2015   ID:  Austin Mcgrath, DOB [birth year], MRN [id number]  PCP:  Lujean Amel, MD  Cardiologist:  Dr. Radford Pax    Chief Complaint  Patient presents with  . Hospitalization Follow-up    post cath, sleep apnea      History of Present Illness: Austin Mcgrath is a 74 y.o. male who presents for follow up after cardiac cath for positive stress test- which was done for some chest discomfort and SOB.    Cardiac cath results:30% max RCA disease.    The left ventricular systolic function is normal.  Mild, nonobstructive coronary artery disease.  Continue aggressive medical therapy. Cardiology follow-up with Dr. Radford Pax.      Other hx. HTN, OSA and dyslipidemia who presents today for followup. He is doing well. He denies any  dizziness, palpitations or syncope. He has chronic SOB that is stable.  He uses the full face mask which he tolerates well. Recently it has been leaking- [facility]  His recent cholesterol is stable.  His BP today is elevated but at home it is lower.  On last labs his glucose was elevated and he is borderline diabetic followed by PCP.  Instructed to monitor diet closer.   For his chest pain try pepcid over the counter, he is no longer on protonix.    He need rt wrist surgery in next few months.     PLEASE NOT ON NUC STUDY PT WAS IN ATRIAL FLUTTER AND DR. Radford Pax WOULD LIKE HIM PLACED ON NOAC.   Past Medical History  Diagnosis Date  . Arteriosclerotic coronary artery disease     nonobstructive/Mild AI -DR Radford Pax  . 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 Burnett Harry  . Hypertension   . Dyslipidemia     elevated cholesterol-Dr Radford Pax  . Aortic valve sclerosis     No etenosis Echo 2012 Mild TR  . TR (tricuspid regurgitation)     Mild  . Gout     Dr Justine Null  . OA (osteoarthritis)     Dr Justine Null  . BPH (benign prostatic hypertrophy)     . 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 Degenerative disc disease     Dr Cyndy Freeze  . Tubular adenoma of colon     colonoscopy 05/2011 repeat in 5 years -Dr. Watt Climes  . Macular degeneration     mild hypertensive retinopaty,macular degeneration,referred to retina   . Screening for AAA (aortic abdominal aneurysm) 02/2012    No aneurysm  . OSA (obstructive sleep apnea)     severe with AHI 43/hr now on BiPAP at 13/9cm H2O    Past Surgical History  Procedure Laterality Date  . L-disk l5-s1 (right)  1993  . Hernia repair  Age 77  . Foot sx  2006  . Cardiac catheterization N/A 04/27/2015    Procedure: Left Heart Cath and Coronary Angiography;  Surgeon: Jettie Booze, MD;  Location: [facility];  Service: Cardiovascular;  Laterality: N/A;     Current Outpatient Prescriptions  Medication Sig Dispense Refill  . allopurinol (ZYLOPRIM) 300 MG tablet Take 300 mg by mouth daily.    Marland Kitchen amLODipine-atorvastatin ([name]) 10-80 MG per tablet Take 1 tablet by mouth daily. 90 tablet 3  . aspirin 81 MG tablet Take 81 mg by mouth daily.    . benazepril (LOTENSIN) 40 MG tablet Take 1 tablet (40 mg  total) by mouth daily. 90 tablet 3  . hydrochlorothiazide (HYDRODIURIL) 25 MG tablet Take 1 tablet (25 mg total) by mouth daily. 90 tablet 3  . ibuprofen (ADVIL,MOTRIN) 200 MG tablet Take 400 mg by mouth as needed for moderate pain (arthritis).     . Multiple Vitamins-Minerals (CENTRUM CARDIO PO) Take 1 tablet by mouth 2 (two) times daily.    . Multiple Vitamins-Minerals (ICAPS MV PO) Take 1 tablet by mouth 2 (two) times daily.    . Omega-3 Fatty Acids (FISH OIL) 1000 MG CAPS Take 4 capsules by mouth daily.    . pantoprazole (PROTONIX) 40 MG tablet Take 40 mg by mouth daily.    . potassium chloride SA (K-DUR,KLOR-CON) 20 MEQ tablet Take 2 tablets (40 mEq total) by mouth 2 (two) times daily.     No current facility-administered medications for this visit.    Allergies:   Contrast media    Social History:  The  patient  reports that he quit smoking about 43 years ago. He does not have any smokeless tobacco history on file. He reports that he does not drink alcohol or use illicit drugs.   Family History:  The patient's family history includes Cancer in his mother, sister, and sister; Hypertension in his brother and sister; Lung cancer in his mother. There is no history of Heart attack or Stroke.    ROS:  General:no colds or fevers, no weight changes Skin:no rashes or ulcers HEENT:no blurred vision, no congestion CV:see HPI, some swelling at ankles.  PUL:see HPI GI:no diarrhea constipation or melena, no indigestion GU:no hematuria, no dysuria MS:no joint pain, no claudication Neuro:no syncope, no lightheadedness Endo:borderline diabetes, no thyroid disease  Wt Readings from Last 3 Encounters:  04/30/15 235 lb (106.595 kg)  04/27/15 235 lb (106.595 kg)  04/24/15 236 lb (107.049 kg)     PHYSICAL EXAM: VS:  BP 145/80 mmHg  Pulse 54  Ht 5\' 11"  (1.803 m)  Wt 235 lb (106.595 kg)  BMI 32.79 kg/m2 , BMI Body mass index is 32.79 kg/(m^2). General:Pleasant affect, NAD Skin:Warm and dry, brisk capillary refill HEENT:normocephalic, sclera clear, mucus membranes moist Neck:supple, no JVD, no bruits  Heart:S1S2 RRR without murmur, gallup, rub or click Lungs:clear without rales, rhonchi, or wheezes LKJ:ZPHX, non tender, + BS, do not palpate liver spleen or masses Ext:no lower ext edema, 2+ pedal pulses, 2+ radial pulses Neuro:alert and oriented, MAE, follows commands, + facial symmetry    EKG:  EKG is ordered today. The ekg ordered today demonstrates SB 1st degree AV block at 224 ms    Recent Labs: 04/16/2015: ALT 20 04/26/2015: BUN 22; Creatinine, Ser 0.92; Hemoglobin 15.7; Platelets 220.0; Potassium 4.3; Sodium 137    Lipid Panel    Component Value Date/Time   CHOL 110 04/16/2015 0944   CHOL 111 10/28/2013 0813   TRIG 101.0 04/16/2015 0944   TRIG 146 10/28/2013 0813   HDL 35.90*  04/16/2015 0944   HDL 37* 10/28/2013 0813   CHOLHDL 3 04/16/2015 0944   VLDL 20.2 04/16/2015 0944   LDLCALC 54 04/16/2015 0944   LDLCALC 45 10/28/2013 0813       Other studies Reviewed: Additional studies/ records that were reviewed today include: cardiac cath..   ASSESSMENT AND PLAN:  1. Cardiac cath for abnormal stress test and occ chest pain.  Cardiac cath with non obstructive disease of RCA and normal EF.   2.  Chest pain add pepcid for now to see if makes a difference he also  has neck pain. Non cardiac pain.   3.  OSA- mask leaks at times, he will go by [facility] to discuss.  4. HTN  Up some today, but he stated at home it is better controlled.   5. Hyperlipidemia:  On statin and last chol stable.   6. Borderline diabetes- monitor diet closely.  7. Hypokalemia treated will recheck today..   8. Atrial flutter pt was noted in a flutter for nuc study with CHA2DS2VASc score of 3 with age, HTN borderline diabetes. Will add eliquis.  5 mg BID -  STOP asa.  - i CALLED HIM AT HOME AFTER THE VISIT AND DISCUSSED IN DETAIL.    Current medicines are reviewed with the patient today.  The patient Has no concerns regarding medicines.  The following changes have been made:  See above Labs/ tests ordered today include:see above  Disposition:   FU:  see above  Signed, Isaiah Serge, NP  04/30/2015 1:43 PM    [facility] [place], [place], [place], [place] Phone: [phone]; Fax: [phone]

## 2015-05-01 ENCOUNTER — Encounter: Payer: Self-pay | Admitting: Cardiology

## 2015-05-01 MED ORDER — APIXABAN 5 MG PO TABS: 5.0000 mg | ORAL_TABLET | Freq: Two times a day (BID) | ORAL | Status: DC

## 2015-05-01 NOTE — Telephone Encounter (Signed)
Patient in to office to drop off Patient Assistance paperwork for signature.  Gave patient Eliquis samples to supply him to 1 month Eliquis check in [facility]. Patient grateful for help.

## 2015-05-01 NOTE — Addendum Note (Signed)
Addended by: Harland German A on: 05/01/2015 05:04 PM   Modules accepted: Orders, Medications

## 2015-05-01 NOTE — Telephone Encounter (Signed)
Provided a medication coupon for Eliquis.

## 2015-05-02 NOTE — Telephone Encounter (Signed)
Per Dr. Radford Pax, patient to have 1 month OV with her to see if still in fib/flutter.

## 2015-05-03 NOTE — Telephone Encounter (Signed)
Sent patient a MyChart message with new appointment information. Instructed him to call back if anything was unclear or he wants to talk.

## 2015-05-03 NOTE — Telephone Encounter (Signed)
Patient scheduled 8/23 at 0930 for [facility]madin Clinic and 8/23 at 1000 with Dr. Radford Pax.  Left message to call back to confirm appointments.

## 2015-05-28 NOTE — Progress Notes (Signed)
Cardiology Office Note   Date:  05/29/2015   ID:  Sagan, Maselli 09-Nov-1940, MRN [id number]  PCP:  Lujean Amel, MD    Chief Complaint  Patient presents with  . OSA      History of Present Illness: Austin Mcgrath is a 74 y.o. male with a history of nonobstructive ASCAD, HTN, OSA and dyslipidemia who presents today for followup. When I last saw he had some tightness in his chest when he would get SOB. He underwent nuclear stress test with no ischemia but reduced LVF and underwent cath showing very minimal nonobstructive ASCAD and normal LVF.   He was recently noted to have atrial flutter at time of nuclear stress test and was placed on Eliquis after his cath.  We are waiting for him to be on this for 4 weeks prior to DCCV.  He is tolerating his CPAP well.   Since I saw him last he denies any chest pain.  He denies any LE edema, dizziness, palpitations or syncope. He has chronic SOB that is stable.     Past Medical History  Diagnosis Date  . Arteriosclerotic coronary artery disease     nonobstructive/Mild AI -DR Radford Pax  . 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 Burnett Harry  . Hypertension   . Dyslipidemia     elevated cholesterol-Dr Radford Pax  . Aortic valve sclerosis     No etenosis Echo 2012 Mild TR  . TR (tricuspid regurgitation)     Mild  . Gout     Dr Justine Null  . OA (osteoarthritis)     Dr Justine Null  . BPH (benign prostatic hypertrophy)   . 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 Degenerative disc disease     Dr Cyndy Freeze  . Tubular adenoma of colon     colonoscopy 05/2011 repeat in 5 years -Dr. Watt Climes  . Macular degeneration     mild hypertensive retinopaty,macular degeneration,referred to retina   . Screening for AAA (aortic abdominal aneurysm) 02/2012    No aneurysm  . OSA (obstructive sleep apnea)     severe with AHI 43/hr now on BiPAP at 13/9cm H2O    Past Surgical History  Procedure Laterality Date  . L-disk l5-s1 (right)   1993  . Hernia repair  Age 70  . Foot sx  2006  . Cardiac catheterization N/A 04/27/2015    Procedure: Left Heart Cath and Coronary Angiography;  Surgeon: Jettie Booze, MD;  Location: [facility];  Service: Cardiovascular;  Laterality: N/A;     Current Outpatient Prescriptions  Medication Sig Dispense Refill  . allopurinol (ZYLOPRIM) 300 MG tablet Take 300 mg by mouth daily.    Marland Kitchen amLODipine-atorvastatin ([name]) 10-80 MG per tablet Take 1 tablet by mouth daily. 90 tablet 3  . apixaban (ELIQUIS) 5 MG TABS tablet Take 1 tablet (5 mg total) by mouth 2 (two) times daily. 180 tablet 3  . benazepril (LOTENSIN) 40 MG tablet Take 1 tablet (40 mg total) by mouth daily. 90 tablet 3  . hydrochlorothiazide (HYDRODIURIL) 25 MG tablet Take 1 tablet (25 mg total) by mouth daily. 90 tablet 3  . ibuprofen (ADVIL,MOTRIN) 200 MG tablet Take 400 mg by mouth as needed for moderate pain (arthritis).     . Multiple Vitamins-Minerals (CENTRUM CARDIO PO) Take 1 tablet by mouth 2 (two) times daily.    . Multiple Vitamins-Minerals (  ICAPS MV PO) Take 1 tablet by mouth 2 (two) times daily.    . Omega 3 1200 MG CAPS Take 4 capsules by mouth daily.    . pantoprazole (PROTONIX) 40 MG tablet Take 40 mg by mouth daily.    . potassium chloride SA (K-DUR,KLOR-CON) 20 MEQ tablet Take 2 tablets (40 mEq total) by mouth 2 (two) times daily. 120 tablet 11   No current facility-administered medications for this visit.    Allergies:   Contrast media    Social History:  The patient  reports that he quit smoking about 43 years ago. He does not have any smokeless tobacco history on file. He reports that he does not drink alcohol or use illicit drugs.   Family History:  The patient's family history includes Cancer in his mother, sister, and sister; Hypertension in his brother and sister; Lung cancer in his mother. There is no history of Heart attack or Stroke.    ROS:  Please see the history of present illness.    Otherwise, review of systems are positive for none.   All other systems are reviewed and negative.    PHYSICAL EXAM: VS:  BP 142/58 mmHg  Pulse 58  Ht 5\' 11"  (1.803 m)  Wt 236 lb 12.8 oz (107.412 kg)  BMI 33.04 kg/m2  SpO2 96% , BMI Body mass index is 33.04 kg/(m^2). GEN: Well nourished, well developed, in no acute distress HEENT: normal Neck: no JVD, carotid bruits, or masses Cardiac: RRR; no murmurs, rubs, or gallops,no edema  Respiratory:  clear to auscultation bilaterally, normal work of breathing GI: soft, nontender, nondistended, + BS MS: no deformity or atrophy Skin: warm and dry, no rash Neuro:  Strength and sensation are intact Psych: euthymic mood, full affect   EKG:  EKG was ordered today and showed sinus bradycardia with first degree AVB and no ST changes    Recent Labs: 04/16/2015: ALT 20 04/26/2015: BUN 22; Creatinine, Ser 0.92; Potassium 4.3; Sodium 137 04/30/2015: Hemoglobin 15.6; Platelets 227.0    Lipid Panel    Component Value Date/Time   CHOL 110 04/16/2015 0944   CHOL 111 10/28/2013 0813   TRIG 101.0 04/16/2015 0944   TRIG 146 10/28/2013 0813   HDL 35.90* 04/16/2015 0944   HDL 37* 10/28/2013 0813   CHOLHDL 3 04/16/2015 0944   VLDL 20.2 04/16/2015 0944   LDLCALC 54 04/16/2015 0944   LDLCALC 45 10/28/2013 0813      Wt Readings from Last 3 Encounters:  05/29/15 236 lb 12.8 oz (107.412 kg)  04/30/15 235 lb (106.595 kg)  04/27/15 235 lb (106.595 kg)    ASSESSMENT AND PLAN:  1. Nonobstructive ASCAD - Lexiscan myoview for recent CP/SOB showed no ischemia but declining EF and cath revealed mild nonobstructive CAD and normal LVF.   - continue ASA  2. HTN - well controlled - continue Benazepril/HCTZ/[name]   3. Dyslipidemia - LDL at goal - continue [name]   4. Bradycardia with some fatigue - improved after stopping BB  5. Severe OSA on BiPAP and tolerating well.       6. Paorxysmal atrial flutter now back in sinus brady.  He was in  sinus rhythm when seen by Cecilie Kicks NP on 7/25 and has been on Eliquis for a month since documenting return of NSR.  He is cleared to come off Eliquis starting 8/26 for hand surgery and restart when cleared by Ortho.    Current medicines are reviewed at length with the patient today.  The patient does not have concerns regarding medicines.  The following changes have been made:  no change  Labs/ tests ordered today: See above Assessment and Plan No orders of the defined types were placed in this encounter.     Disposition:   FU with me in 1 year  Signed, Sueanne Margarita, MD  05/29/2015 9:48 AM    [facility] [place], [place], [place]  [address] Phone: [phone]; Fax: [phone]

## 2015-05-29 ENCOUNTER — Encounter: Payer: Self-pay | Admitting: Cardiology

## 2015-05-29 ENCOUNTER — Ambulatory Visit (INDEPENDENT_AMBULATORY_CARE_PROVIDER_SITE_OTHER): Payer: Medicare Other | Admitting: *Deleted

## 2015-05-29 ENCOUNTER — Ambulatory Visit (INDEPENDENT_AMBULATORY_CARE_PROVIDER_SITE_OTHER): Payer: Medicare Other | Admitting: Cardiology

## 2015-05-29 VITALS — BP 142/58 | HR 58 | Ht 71.0 in | Wt 236.8 lb

## 2015-05-29 DIAGNOSIS — I4892 Unspecified atrial flutter: Secondary | ICD-10-CM

## 2015-05-29 DIAGNOSIS — I10 Essential (primary) hypertension: Secondary | ICD-10-CM

## 2015-05-29 DIAGNOSIS — G4733 Obstructive sleep apnea (adult) (pediatric): Secondary | ICD-10-CM

## 2015-05-29 DIAGNOSIS — I4821 Permanent atrial fibrillation: Secondary | ICD-10-CM | POA: Insufficient documentation

## 2015-05-29 DIAGNOSIS — E785 Hyperlipidemia, unspecified: Secondary | ICD-10-CM

## 2015-05-29 DIAGNOSIS — I2510 Atherosclerotic heart disease of native coronary artery without angina pectoris: Secondary | ICD-10-CM | POA: Diagnosis not present

## 2015-05-29 DIAGNOSIS — T50905A Adverse effect of unspecified drugs, medicaments and biological substances, initial encounter: Secondary | ICD-10-CM

## 2015-05-29 DIAGNOSIS — R001 Bradycardia, unspecified: Secondary | ICD-10-CM

## 2015-05-29 HISTORY — DX: Unspecified atrial flutter: I48.92

## 2015-05-29 LAB — CBC
HEMATOCRIT: 44.9 % (ref 39.0–52.0)
HEMOGLOBIN: 15.3 g/dL (ref 13.0–17.0)
MCHC: 34.2 g/dL (ref 30.0–36.0)
MCV: 83.9 fl (ref 78.0–100.0)
Platelets: 213 10*3/uL (ref 150.0–400.0)
RBC: 5.35 Mil/uL (ref 4.22–5.81)
RDW: 14.2 % (ref 11.5–15.5)
WBC: 7.3 10*3/uL (ref 4.0–10.5)

## 2015-05-29 LAB — BASIC METABOLIC PANEL
BUN: 13 mg/dL (ref 6–23)
CO2: 33 mEq/L — ABNORMAL HIGH (ref 19–32)
CREATININE: 0.75 mg/dL (ref 0.40–1.50)
Calcium: 9.6 mg/dL (ref 8.4–10.5)
Chloride: 101 mEq/L (ref 96–112)
GFR: 108.65 mL/min (ref >60.00)
Glucose, Bld: 205 mg/dL — ABNORMAL HIGH (ref 70–99)
Potassium: 3.5 mEq/L (ref 3.5–5.1)
SODIUM: 141 meq/L (ref 135–145)

## 2015-05-29 NOTE — Progress Notes (Signed)
Pt was started on Eliquis 5mg s BID for Aflutter on 04/30/2015.    Pt comes in for 1 mth follow up,and saw Dr Radford Pax today. Reviewed patients medication list.  Pt is not  currently on any combined P-gp and strong CYP3A4 inhibitors/inducers (ketoconazole, traconazole, ritonavir, carbamazepine, phenytoin, rifampin, St. John's wort).  Reviewed labs;  SCr 0.75,  Weight 107 Kg, Age 74 yrs old.  Dose  Appropriate based on specified criteria.   Hgb and HCT 15.3/44.9.   A full discussion of the nature of anticoagulants has been carried out.  A benefit/risk analysis has been presented to the patient, so that they understand the justification for choosing anticoagulation with Eliquis at this time.  The need for compliance is stressed.  Pt is aware to take the medication twice daily.  Side effects of potential bleeding are discussed, including unusual colored urine or stools, coughing up blood or coffee ground emesis, nose bleeds or serious fall or head trauma.  Discussed signs and symptoms of stroke. The patient should avoid any OTC items containing aspirin or ibuprofen.  Avoid alcohol consumption.   Call if any signs of abnormal bleeding.  Discussed financial obligations and resolved any difficulty in obtaining medication.  Pt aware to follow up with Dr Radford Pax as scheduled.

## 2015-05-29 NOTE — Patient Instructions (Signed)
Medication Instructions:  Your physician has recommended you make the following change in your medication:  1) STOP ELIQUIS 8/26 and RESTART ELIQUIS when cleared by ortho after your surgery  Labwork: None  Testing/Procedures: None  Follow-Up: Your physician wants you to follow-up in: 6 months with Dr. Radford Pax. You will receive a reminder letter in the mail two months in advance. If you don't receive a letter, please call our office to schedule the follow-up appointment.   Any Other Special Instructions Will Be Listed Below (If Applicable).

## 2015-05-31 ENCOUNTER — Telehealth: Payer: Self-pay

## 2015-05-31 ENCOUNTER — Encounter: Payer: Self-pay | Admitting: Cardiology

## 2015-05-31 DIAGNOSIS — I10 Essential (primary) hypertension: Secondary | ICD-10-CM

## 2015-05-31 MED ORDER — POTASSIUM CHLORIDE CRYS ER 20 MEQ PO TBCR: 40.0000 meq | EXTENDED_RELEASE_TABLET | ORAL | Status: DC

## 2015-05-31 MED ORDER — TRIAMTERENE-HCTZ 37.5-25 MG PO TABS: 1.0000 | ORAL_TABLET | Freq: Every day | ORAL | Status: DC

## 2015-05-31 NOTE — Telephone Encounter (Signed)
-----   Message from Sueanne Margarita, MD sent at 05/31/2015  4:46 PM EDT ----- Potassium still low normal - change HCTZ to maxide 37.5/25mg  daily and repeat BMET on Monday.  Drop potassium back to 63meq in am and 34meq in pm since we are changing to potassium sparing diuretic.  In addition to BMET on Monday get a Renen/aldosterone ratio and serum aldosterone level.

## 2015-05-31 NOTE — Telephone Encounter (Signed)
Instructed patient to STOP HCTZ and START MAXIDE 37.5/25 mg daily. Patient understands to DECREASE POTASSIUM back to 40 meq in the AM and 20 meq in the PM. Patient cannot come Monday for lab work. He will come Wednesday for BMET, Renin/aldosterone ratio and serum aldosterone level.

## 2015-06-05 ENCOUNTER — Encounter: Payer: Self-pay | Admitting: Cardiology

## 2015-06-06 ENCOUNTER — Other Ambulatory Visit (INDEPENDENT_AMBULATORY_CARE_PROVIDER_SITE_OTHER): Payer: Medicare Other | Admitting: *Deleted

## 2015-06-06 DIAGNOSIS — I10 Essential (primary) hypertension: Secondary | ICD-10-CM

## 2015-06-06 LAB — BASIC METABOLIC PANEL
BUN: 14 mg/dL (ref 6–23)
CO2: 33 meq/L — AB (ref 19–32)
Calcium: 9.4 mg/dL (ref 8.4–10.5)
Chloride: 100 mEq/L (ref 96–112)
Creatinine, Ser: 0.85 mg/dL (ref 0.40–1.50)
GFR: 94.03 mL/min (ref >60.00)
GLUCOSE: 152 mg/dL — AB (ref 70–99)
POTASSIUM: 3.8 meq/L (ref 3.5–5.1)
SODIUM: 138 meq/L (ref 135–145)

## 2015-06-11 NOTE — Progress Notes (Signed)
Needs to be done

## 2015-06-12 LAB — ALDOSTERONE: Aldosterone, Serum: 23 ng/dL

## 2015-06-13 LAB — ALDOSTERONE + RENIN ACTIVITY W/ RATIO
ALDO / PRA Ratio: 46.7 Ratio — ABNORMAL HIGH (ref 0.9–28.9)
Aldosterone: 21 ng/dL
PRA LC/MS/MS: 0.45 ng/mL/h (ref 0.25–5.82)

## 2015-06-15 ENCOUNTER — Telehealth: Payer: Self-pay | Admitting: Cardiology

## 2015-06-15 DIAGNOSIS — E876 Hypokalemia: Secondary | ICD-10-CM

## 2015-06-15 DIAGNOSIS — I10 Essential (primary) hypertension: Secondary | ICD-10-CM

## 2015-06-15 NOTE — Telephone Encounter (Signed)
Informed patient of results and verbal understanding expressed.  Referral to Dr. Buddy Duty placed. Patient agrees with treatment plan.

## 2015-06-15 NOTE — Telephone Encounter (Signed)
New message   Pt states he is returning [place] call  Please call pt

## 2015-06-15 NOTE — Telephone Encounter (Signed)
-----   Message from Sueanne Margarita, MD sent at 06/13/2015  8:36 PM EDT ----- Aldo/PRA level elevated - please refer patient to Dr. Buddy Duty to w/u persistent hypokalemia in the setting of HTN and significant potassium suppl

## 2015-07-24 ENCOUNTER — Telehealth: Payer: Self-pay

## 2015-07-24 NOTE — Telephone Encounter (Signed)
Patient called in for refill of [name] from [employer] Patient Assistance Program. (ID #[id number])  His enrollment had expired on April 19, 2015 according to [employer].  I called the patient and advised him to call [employer] to start the re-enrollment process right away.  (The physician line # [phone])

## 2015-07-27 ENCOUNTER — Other Ambulatory Visit: Payer: Self-pay

## 2015-07-27 MED ORDER — AMLODIPINE-ATORVASTATIN 10-80 MG PO TABS: 1.0000 | ORAL_TABLET | Freq: Every day | ORAL | Status: DC

## 2015-08-01 ENCOUNTER — Telehealth: Payer: Self-pay | Admitting: Cardiology

## 2015-08-01 ENCOUNTER — Encounter: Payer: Self-pay | Admitting: Cardiology

## 2015-08-01 NOTE — Telephone Encounter (Signed)
New message      Calling to get an update on getting assistance for his caduet.  Please call when you return to the office.

## 2015-08-02 NOTE — Telephone Encounter (Signed)
Informed patient this was just sent Monday 10/24. He said [employer] they did not get it. I have faxed it again to [phone]

## 2015-08-08 ENCOUNTER — Telehealth: Payer: Self-pay

## 2015-08-08 NOTE — Telephone Encounter (Signed)
[name]  10-80, 3 bottles of 30 each arrived. Patient informed.

## 2015-08-16 ENCOUNTER — Encounter: Payer: Self-pay | Admitting: Cardiology

## 2015-08-16 ENCOUNTER — Telehealth (HOSPITAL_COMMUNITY): Payer: Self-pay

## 2015-08-16 NOTE — Telephone Encounter (Signed)
---   Message -----      From: [name]     Sent: 08/16/2015 12:07 PM      To: [facility] Triage    Subject: Visit Follow-Up Question                   Austin Mcgrath!        My Dad wanted me to ask you if you could update his med profile on the portals )that he reads on-line and that the other doctors can look at) and remove the following meds.        Dr Buddy Duty took him off the maxzide and potassium. He reduced his benazepril to 20mg  qd and he has him on spironolactone 50mg  bid.        Dad also says he doesn't take the centrum or the protonix anymore either that Dr Dorthy Cooler stopped those. Dr Dorthy Cooler also said if he was having heartburn issues he could take the over the counter Zantac 150mg  bid.        Thanks,    Austin Mcgrath (daughter)        PS both of these doctors have said they have sent this info to the office    Med list updated. Message sent to patient as notification.

## 2015-09-06 ENCOUNTER — Encounter: Payer: Self-pay | Admitting: Cardiology

## 2015-10-17 ENCOUNTER — Other Ambulatory Visit: Payer: Self-pay

## 2015-10-17 DIAGNOSIS — I4892 Unspecified atrial flutter: Secondary | ICD-10-CM

## 2015-10-17 MED ORDER — AMLODIPINE-ATORVASTATIN 10-80 MG PO TABS: 1.0000 | ORAL_TABLET | Freq: Every day | ORAL | Status: DC

## 2015-10-17 MED ORDER — APIXABAN 5 MG PO TABS: 5.0000 mg | ORAL_TABLET | Freq: Two times a day (BID) | ORAL | Status: DC

## 2015-10-23 ENCOUNTER — Telehealth: Payer: Self-pay | Admitting: Cardiology

## 2015-10-23 NOTE — Telephone Encounter (Signed)
Spoke with patient's wife to inform her that Patient Assist Forms were sent last week.

## 2015-10-23 NOTE — Telephone Encounter (Signed)
New message     Talk to the nurse to see if we completed the paper work to get assistance with his eliquis and caduet.  Patient dropped off papers 10-09-15.  Please call

## 2015-11-06 ENCOUNTER — Other Ambulatory Visit: Payer: Self-pay

## 2015-11-06 MED ORDER — AMLODIPINE-ATORVASTATIN 10-80 MG PO TABS: 1.0000 | ORAL_TABLET | Freq: Every day | ORAL | Status: DC

## 2015-11-08 ENCOUNTER — Ambulatory Visit (INDEPENDENT_AMBULATORY_CARE_PROVIDER_SITE_OTHER): Payer: Medicare Other | Admitting: Ophthalmology

## 2015-11-08 DIAGNOSIS — E113293 Type 2 diabetes mellitus with mild nonproliferative diabetic retinopathy without macular edema, bilateral: Secondary | ICD-10-CM

## 2015-11-08 DIAGNOSIS — I10 Essential (primary) hypertension: Secondary | ICD-10-CM | POA: Diagnosis not present

## 2015-11-08 DIAGNOSIS — H35033 Hypertensive retinopathy, bilateral: Secondary | ICD-10-CM

## 2015-11-08 DIAGNOSIS — E11319 Type 2 diabetes mellitus with unspecified diabetic retinopathy without macular edema: Secondary | ICD-10-CM

## 2015-11-08 DIAGNOSIS — H43813 Vitreous degeneration, bilateral: Secondary | ICD-10-CM

## 2015-11-08 DIAGNOSIS — H353134 Nonexudative age-related macular degeneration, bilateral, advanced atrophic with subfoveal involvement: Secondary | ICD-10-CM | POA: Diagnosis not present

## 2015-11-08 DIAGNOSIS — H2513 Age-related nuclear cataract, bilateral: Secondary | ICD-10-CM

## 2015-11-09 ENCOUNTER — Encounter: Payer: Self-pay | Admitting: Cardiology

## 2015-11-12 ENCOUNTER — Telehealth: Payer: Self-pay

## 2015-11-12 NOTE — Telephone Encounter (Signed)
Patient shipment of [name] 10/80 3 bottles of 30 each arrived. Patient notified.

## 2015-12-18 NOTE — Progress Notes (Signed)
Cardiology Office Note   Date:  12/19/2015   ID:  Austin Mcgrath, DOB [birth year], MRN [id number]  PCP:  Lujean Amel, MD    Chief Complaint  Patient presents with  . Coronary Artery Disease  . Atrial Flutter  . Hypertension      History of Present Illness: Austin Mcgrath is a 75 y.o. male with a history of nonobstructive ASCAD, HTN, OSA, paroxysmal atrial flutter on Eliquis and dyslipidemia who presents today for followup. He is tolerating his BiPAP well. He uses the nasal mask but during the night he gets condensation in the mask.  He feels the pressure is adequate.  He feels rested in the am but does get sleepy during the day when watching TV.  He goes to bed between 9:30 and 12MN and sleeps well at night.  He gets up anywhere from 3-9am.  His wife says that he may snore a little.  He denies any chest pain. He denies any LE edema, dizziness, palpitations or syncope. He has chronic SOB that is stable and has not changed since I saw him last. He has not had any further atrial flutter that I am aware of.     Past Medical History  Diagnosis Date  . Arteriosclerotic coronary artery disease     nonobstructive/Mild AI -DR Radford Pax  . 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 Burnett Harry  . Hypertension   . Dyslipidemia     elevated cholesterol-Dr Radford Pax  . Aortic valve sclerosis     No etenosis Echo 2012 Mild TR  . TR (tricuspid regurgitation)     Mild  . Gout     Dr Justine Null  . OA (osteoarthritis)     Dr Justine Null  . BPH (benign prostatic hypertrophy)   . 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 Degenerative disc disease     Dr Cyndy Freeze  . Tubular adenoma of colon     colonoscopy 05/2011 repeat in 5 years -Dr. Watt Climes  . Macular degeneration     mild hypertensive retinopaty,macular degeneration,referred to retina   . Screening for AAA (aortic abdominal aneurysm) 02/2012    No aneurysm  . OSA (obstructive sleep apnea)     severe with AHI  43/hr now on BiPAP at 13/9cm H2O  . Atrial flutter, paroxysmal ([place]) 05/29/2015    Past Surgical History  Procedure Laterality Date  . L-disk l5-s1 (right)  1993  . Hernia repair  Age 89  . Foot sx  2006  . Cardiac catheterization N/A 04/27/2015    Procedure: Left Heart Cath and Coronary Angiography;  Surgeon: Jettie Booze, MD;  Location: [facility];  Service: Cardiovascular;  Laterality: N/A;  . Appendectomy    . Bunionectomy       Current Outpatient Prescriptions  Medication Sig Dispense Refill  . allopurinol (ZYLOPRIM) 300 MG tablet Take 300 mg by mouth daily.    Marland Kitchen amLODipine-atorvastatin ([name]) 10-80 MG tablet Take 1 tablet by mouth daily. 30 tablet 1  . apixaban (ELIQUIS) 5 MG TABS tablet Take 1 tablet (5 mg total) by mouth 2 (two) times daily. 180 tablet 3  . benazepril (LOTENSIN) 20 MG tablet Take 20 mg by mouth daily.    Marland Kitchen ibuprofen (ADVIL,MOTRIN) 200 MG tablet Take 400 mg by mouth as needed for moderate pain (arthritis).     . metFORMIN (GLUCOPHAGE) 500 MG tablet Take  500 mg by mouth 2 (two) times daily with a meal.    . Multiple Vitamins-Minerals (ICAPS MV PO) Take 1 tablet by mouth 2 (two) times daily.    . Omega 3 1200 MG CAPS Take 4 capsules by mouth daily.    . ranitidine (ZANTAC) 150 MG tablet Take 150 mg by mouth 2 (two) times daily as needed for heartburn.    . spironolactone (ALDACTONE) 50 MG tablet Take 50 mg by mouth 2 (two) times daily.     No current facility-administered medications for this visit.    Allergies:   Contrast media    Social History:  The patient  reports that he quit smoking about 44 years ago. He does not have any smokeless tobacco history on file. He reports that he does not drink alcohol or use illicit drugs.   Family History:  The patient's family history includes Cancer in his mother, sister, and sister; Hypertension in his brother and sister; Lung cancer in his mother. There is no history of Heart attack or Stroke.     ROS:  Please see the history of present illness.   Otherwise, review of systems are positive for none.   All other systems are reviewed and negative.    PHYSICAL EXAM: VS:  BP 110/68 mmHg  Pulse 60  Ht 5\' 11"  (1.803 m)  Wt 235 lb 6.4 oz (106.777 kg)  BMI 32.85 kg/m2 , BMI Body mass index is 32.85 kg/(m^2). GEN: Well nourished, well developed, in no acute distress HEENT: normal Neck: no JVD, carotid bruits, or masses Cardiac: RRR; no murmurs, rubs, or gallops,no edema  Respiratory:  clear to auscultation bilaterally, normal work of breathing GI: soft, nontender, nondistended, + BS MS: no deformity or atrophy Skin: warm and dry, no rash Neuro:  Strength and sensation are intact Psych: euthymic mood, full affect   EKG:  EKG is not ordered today.    Recent Labs: 04/16/2015: ALT 20 05/29/2015: Hemoglobin 15.3; Platelets 213.0 06/06/2015: BUN 14; Creatinine, Ser 0.85; Potassium 3.8; Sodium 138    Lipid Panel    Component Value Date/Time   CHOL 110 04/16/2015 0944   CHOL 111 10/28/2013 0813   TRIG 101.0 04/16/2015 0944   TRIG 146 10/28/2013 0813   HDL 35.90* 04/16/2015 0944   HDL 37* 10/28/2013 0813   CHOLHDL 3 04/16/2015 0944   VLDL 20.2 04/16/2015 0944   LDLCALC 54 04/16/2015 0944   LDLCALC 45 10/28/2013 0813      Wt Readings from Last 3 Encounters:  12/19/15 235 lb 6.4 oz (106.777 kg)  05/29/15 236 lb 12.8 oz (107.412 kg)  04/30/15 235 lb (106.595 kg)      ASSESSMENT AND PLAN:  1. Nonobstructive ASCAD - Lexiscan myoview  showed no ischemia but declining EF and cath revealed mild nonobstructive CAD and normal LVF.He has not had any angina.   - He is not on ASA due to being on Apixaban.   2. HTN - well controlled - continue Benazepril/aldactone/[name]   3. Dyslipidemia - LDL at goal - continue [name]  - check FLP and ALT  4. Bradycardia with some fatigue - improved after stopping BB  5. Severe OSA on BiPAP and tolerating well.His D/L  today showed an AHI of 0.8/hr on BiPAP at 13/9cm H2O with 80% compliance in using more than 4 hours nightly.  I instructed him to check his instruction booklet to learn how to turn the humidity down which is causing condensation in his mask.  If he cannot  figure it out he is to call his DME.    6. Paorxysmal atrial flutter maintaining sinus brady. Continue Eliquis. Check NOAC panel.   Current medicines are reviewed at length with the patient today.  The patient does not have concerns regarding medicines.  The following changes have been made:  no change  Labs/ tests ordered today: See above Assessment and Plan No orders of the defined types were placed in this encounter.     Disposition:   FU with me in 6 months  Signed, Sueanne Margarita, MD  12/19/2015 9:58 AM    [facility] [address], [place], [place]  [address] Phone: [phone]; Fax: [phone]

## 2015-12-19 ENCOUNTER — Encounter: Payer: Self-pay | Admitting: Cardiology

## 2015-12-19 ENCOUNTER — Ambulatory Visit (INDEPENDENT_AMBULATORY_CARE_PROVIDER_SITE_OTHER): Payer: Medicare Other | Admitting: Cardiology

## 2015-12-19 VITALS — BP 110/68 | HR 60 | Ht 71.0 in | Wt 235.4 lb

## 2015-12-19 DIAGNOSIS — G4733 Obstructive sleep apnea (adult) (pediatric): Secondary | ICD-10-CM | POA: Diagnosis not present

## 2015-12-19 DIAGNOSIS — I4892 Unspecified atrial flutter: Secondary | ICD-10-CM

## 2015-12-19 DIAGNOSIS — E785 Hyperlipidemia, unspecified: Secondary | ICD-10-CM

## 2015-12-19 DIAGNOSIS — I2510 Atherosclerotic heart disease of native coronary artery without angina pectoris: Secondary | ICD-10-CM

## 2015-12-19 DIAGNOSIS — R001 Bradycardia, unspecified: Secondary | ICD-10-CM | POA: Diagnosis not present

## 2015-12-19 DIAGNOSIS — T50905A Adverse effect of unspecified drugs, medicaments and biological substances, initial encounter: Secondary | ICD-10-CM

## 2015-12-19 DIAGNOSIS — I10 Essential (primary) hypertension: Secondary | ICD-10-CM

## 2015-12-19 LAB — HEPATIC FUNCTION PANEL
ALBUMIN: 4 g/dL (ref 3.6–5.1)
ALT: 20 U/L (ref 9–46)
AST: 19 U/L (ref 10–35)
Alkaline Phosphatase: 54 U/L (ref 40–115)
BILIRUBIN TOTAL: 0.6 mg/dL (ref 0.2–1.2)
Bilirubin, Direct: 0.1 mg/dL (ref <=0.2)
Indirect Bilirubin: 0.5 mg/dL (ref 0.2–1.2)
TOTAL PROTEIN: 6.6 g/dL (ref 6.1–8.1)

## 2015-12-19 LAB — CBC WITH DIFFERENTIAL/PLATELET
BASOS PCT: 0 % (ref 0–1)
Basophils Absolute: 0 10*3/uL (ref 0.0–0.1)
EOS ABS: 0.1 10*3/uL (ref 0.0–0.7)
EOS PCT: 1 % (ref 0–5)
HCT: 40.3 % (ref 39.0–52.0)
Hemoglobin: 13.9 g/dL (ref 13.0–17.0)
LYMPHS PCT: 18 % (ref 12–46)
Lymphs Abs: 1.5 10*3/uL (ref 0.7–4.0)
MCH: 29.7 pg (ref 26.0–34.0)
MCHC: 34.5 g/dL (ref 30.0–36.0)
MCV: 86.1 fL (ref 78.0–100.0)
MONO ABS: 0.8 10*3/uL (ref 0.1–1.0)
MONOS PCT: 10 % (ref 3–12)
MPV: 9.2 fL (ref 8.6–12.4)
NEUTROS ABS: 5.9 10*3/uL (ref 1.7–7.7)
Neutrophils Relative %: 71 % (ref 43–77)
PLATELETS: 205 10*3/uL (ref 150–400)
RBC: 4.68 MIL/uL (ref 4.22–5.81)
RDW: 14 % (ref 11.5–15.5)
WBC: 8.3 10*3/uL (ref 4.0–10.5)

## 2015-12-19 LAB — LIPID PANEL
CHOL/HDL RATIO: 2.5 ratio (ref <=5.0)
CHOLESTEROL: 79 mg/dL — AB (ref 125–200)
HDL: 31 mg/dL — AB (ref >=40)
LDL Cholesterol: 21 mg/dL (ref <130)
Triglycerides: 136 mg/dL (ref <150)
VLDL: 27 mg/dL (ref <30)

## 2015-12-19 LAB — BASIC METABOLIC PANEL
BUN: 19 mg/dL (ref 7–25)
CALCIUM: 9.1 mg/dL (ref 8.6–10.3)
CO2: 24 mmol/L (ref 20–31)
Chloride: 102 mmol/L (ref 98–110)
Creat: 1.16 mg/dL (ref 0.70–1.18)
GLUCOSE: 110 mg/dL — AB (ref 65–99)
Potassium: 4.7 mmol/L (ref 3.5–5.3)
SODIUM: 138 mmol/L (ref 135–146)

## 2015-12-19 NOTE — Patient Instructions (Signed)
Medication Instructions:  Your physician recommends that you continue on your current medications as directed. Please refer to the Current Medication list given to you today.   Labwork: TODAY: BMET, CBC, LFTs, Lipids  Testing/Procedures: None  Follow-Up: Your physician wants you to follow-up in: 6 months with Dr. Turner. You will receive a reminder letter in the mail two months in advance. If you don't receive a letter, please call our office to schedule the follow-up appointment.   Any Other Special Instructions Will Be Listed Below (If Applicable).     If you need a refill on your cardiac medications before your next appointment, please call your pharmacy.   

## 2015-12-19 NOTE — Addendum Note (Signed)
Addended by: Fransico Him R on: 12/19/2015 10:18 AM   Modules accepted: Miquel Dunn

## 2015-12-24 ENCOUNTER — Encounter: Payer: Self-pay | Admitting: Cardiology

## 2016-01-14 ENCOUNTER — Telehealth: Payer: Self-pay

## 2016-01-14 NOTE — Telephone Encounter (Signed)
[name] 10/80 arrived from [employer]. Patient notified, 3 bottles of 30 each to pick up.

## 2016-05-19 ENCOUNTER — Telehealth: Payer: Self-pay

## 2016-05-19 NOTE — Telephone Encounter (Signed)
[name] 10/80 re-ordered through [employer]. Patient is currently out. Will offer small rx to be sent to local pharmacy.

## 2016-05-20 ENCOUNTER — Telehealth: Payer: Self-pay

## 2016-05-20 NOTE — Telephone Encounter (Signed)
Patient should hold Eliquis 48 hours prior to colonoscopy

## 2016-05-20 NOTE — Telephone Encounter (Addendum)
Clearance request for instruction to STOP ELIQUIS for colonoscopy received from [facility] (Dr. Watt Climes).  Colonoscopy is scheduled 07/02/2016. Clearance to be faxed to [facility] at [phone].  To Dr. Radford Pax.

## 2016-05-21 NOTE — Telephone Encounter (Signed)
Faxed to [facility].

## 2016-05-26 ENCOUNTER — Telehealth: Payer: Self-pay

## 2016-05-26 NOTE — Telephone Encounter (Signed)
Patient's wife notified that [name] 10/80 3 bottles has arrived. Medicine placed at front desk.

## 2016-06-06 ENCOUNTER — Encounter: Payer: Self-pay | Admitting: Cardiology

## 2016-06-19 ENCOUNTER — Encounter: Payer: Self-pay | Admitting: Cardiology

## 2016-06-21 ENCOUNTER — Encounter: Payer: Self-pay | Admitting: Cardiology

## 2016-06-21 NOTE — Progress Notes (Signed)
Cardiology Office Note    Date:  06/23/2016   ID:  Austin Mcgrath, DOB 11/21/1940, MRN [id number]  PCP:  Lujean Amel, MD  Cardiologist:  Fransico Him, MD   Chief Complaint  Patient presents with  . Coronary Artery Disease    no sx  . Hypertension  . Atrial Flutter  . Sleep Apnea    History of Present Illness:  Austin Mcgrath is a 75 y.o. male with a history of nonobstructive ASCAD, HTN, OSA, paroxysmal atrial flutter on Eliquis and dyslipidemia who presents today for followup. He is tolerating his BiPAP well. He uses the nasal mask which is working fine.  He feels the pressure is adequate.  He still feels tired in the am.  He goes to bed at 9:30pm and falls asleep by 10pm.  He wakes up frequently during the night to ure the restroom and stays up in the recliner for 1-2 hours and then goes back to bed.   He denies any chest pain. He denies any LE edema, dizziness, palpitations or syncope. He has chronic SOB that is stable and has not changed since I saw him last. He has not had any further atrial flutter that I am aware of.      Past Medical History:  Diagnosis Date  . Aortic valve sclerosis    No etenosis Echo 2012 Mild TR  . Arteriosclerotic coronary artery disease    nonobstructive/Mild AI -DR Radford Pax  . Atrial flutter, paroxysmal ([place]) 05/29/2015   CHADS2VASC score is 4 on chronic anticoaguation with DOAC  . BPH (benign prostatic hypertrophy)   . Degenerative disc disease    Dr Cyndy Freeze  . Diabetes mellitus without complication ([place])    Type II w/ microalbuminuria-Dr. Burnett Harry  . Dyslipidemia    elevated cholesterol-Dr Radford Pax  . Gout    Dr Justine Null  . 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 Hypertension   . Macular degeneration    mild hypertensive retinopaty,macular degeneration,referred to retina   . OA (osteoarthritis)    Dr Justine Null  . OSA (obstructive sleep apnea)    severe with AHI 43/hr now on BiPAP at 13/9cm H2O  . Screening for AAA (aortic abdominal aneurysm) 02/2012   No aneurysm  . TR (tricuspid regurgitation)    Mild  . Tubular adenoma of colon    colonoscopy 05/2011 repeat in 5 years -Dr. Watt Climes    Past Surgical History:  Procedure Laterality Date  . APPENDECTOMY    . BUNIONECTOMY    . CARDIAC CATHETERIZATION N/A 04/27/2015   Procedure: Left Heart Cath and Coronary Angiography;  Surgeon: Jettie Booze, MD;  Location: [facility];  Service: Cardiovascular;  Laterality: N/A;  . foot sx  2006  . HERNIA REPAIR  Age 33  . L-Disk L5-S1 (right)  1993    Current Medications: Outpatient Medications Prior to Visit  Medication Sig Dispense Refill  . allopurinol (ZYLOPRIM) 300 MG tablet Take 300 mg by mouth daily.    Marland Kitchen amLODipine-atorvastatin ([name]) 10-80 MG tablet Take 1 tablet by mouth daily. 30 tablet 1  . apixaban (ELIQUIS) 5 MG TABS tablet Take 1 tablet (5 mg total) by mouth 2 (two) times daily. 180 tablet 3  . benazepril (LOTENSIN) 20 MG tablet Take 20 mg by mouth daily.    Marland Kitchen ibuprofen (ADVIL,MOTRIN) 200 MG tablet Take 400 mg by mouth as needed for moderate pain (arthritis).     . metFORMIN (GLUCOPHAGE) 500 MG tablet Take 500 mg by mouth 2 (two)  times daily with a meal.    . Omega 3 1200 MG CAPS Take 4 capsules by mouth daily.    Marland Kitchen spironolactone (ALDACTONE) 50 MG tablet Take 50 mg by mouth 2 (two) times daily.    . Multiple Vitamins-Minerals (ICAPS MV PO) Take 1 tablet by mouth 2 (two) times daily.    . ranitidine (ZANTAC) 150 MG tablet Take 150 mg by mouth 2 (two) times daily as needed for heartburn.     No facility-administered medications prior to visit.      Allergies:   Contrast media [iodinated diagnostic agents]   Social History   Social History  . Marital status: Married    Spouse name: N/A  . Number of children: N/A  . Years of education: N/A   Social History Main Topics  . Smoking status: Former Smoker    Quit date: 10/07/1971  . Smokeless tobacco: Never Used  . Alcohol use No  . Drug use: No  . Sexual activity:  Not Asked   Other Topics Concern  . None   Social History Narrative  . None     Family History:  The patient's family history includes Cancer in his mother, sister, and sister; Hypertension in his brother and sister; Lung cancer in his mother.   ROS:   Please see the history of present illness.    Review of Systems  Musculoskeletal: Positive for back pain and muscle cramps.  Gastrointestinal: Positive for constipation.   All other systems reviewed and are negative.  No flowsheet data found.     PHYSICAL EXAM:   VS:  BP 100/60   Pulse 69   Ht 5\' 11"  (1.803 m)   Wt 240 lb 1.9 oz (108.9 kg)   SpO2 96%   BMI 33.49 kg/m    GEN: Well nourished, well developed, in no acute distress  HEENT: normal  Neck: no JVD, carotid bruits, or masses Cardiac: RRR; no murmurs, rubs, or gallops,no edema.  Intact distal pulses bilaterally.  Respiratory:  clear to auscultation bilaterally, normal work of breathing GI: soft, nontender, nondistended, + BS MS: no deformity or atrophy  Skin: warm and dry, no rash Neuro:  Alert and Oriented x 3, Strength and sensation are intact Psych: euthymic mood, full affect  Wt Readings from Last 3 Encounters:  06/23/16 240 lb 1.9 oz (108.9 kg)  12/19/15 235 lb 6.4 oz (106.8 kg)  05/29/15 236 lb 12.8 oz (107.4 kg)      Studies/Labs Reviewed:   EKG:  EKG is  ordered today.  The ekg ordered today demonstrates atrial fibrillation with CVR  Recent Labs: 12/19/2015: ALT 20; BUN 19; Creat 1.16; Hemoglobin 13.9; Platelets 205; Potassium 4.7; Sodium 138   Lipid Panel    Component Value Date/Time   CHOL 79 (L) 12/19/2015 1016   CHOL 111 10/28/2013 0813   TRIG 136 12/19/2015 1016   TRIG 146 10/28/2013 0813   HDL 31 (L) 12/19/2015 1016   HDL 37 (L) 10/28/2013 0813   CHOLHDL 2.5 12/19/2015 1016   VLDL 27 12/19/2015 1016   LDLCALC 21 12/19/2015 1016   LDLCALC 45 10/28/2013 0813    Additional studies/ records that were reviewed today include:  CPAP  download    ASSESSMENT:    1. Atrial flutter, paroxysmal ([place])   2. Benign essential HTN   3. Atherosclerosis of native coronary artery of native heart without angina pectoris   4. OSA (obstructive sleep apnea)   5. Hyperlipidemia      PLAN:  In order of problems listed above: 1.  Paroxysmal atrial flutter-  He is now in atrial fibrillation with CVR and is well rate controlled.  We discussed rate vs. Rhythm control.  He is completely asymptomatic and we have decided to continue rate control. Continue apixaban.  Will check BMET and CBC for chronic monitoring of anticoagulant. 2.  HTN - BP controlled. Continue amlodipine/ACE I and spironolactone.   3.  ASCAD - nonobstructive.  He has not had any anginal symptoms.  Continue statin.  No ASA due to DOAC. 4.  OSA - the patient is tolerating PAP therapy well without any problems. The PAP download was reviewed today and showed an AHI of 1.3/hr on 13/9 cm H2O with 93% compliance in using more than 4 hours nightly.  The patient has been using and benefiting from CPAP use and will continue to benefit from therapy.  5.  Hyperlipidemia with LDL goal < 70.  Continue statin.  Check FLP and ALT>   Medication Adjustments/Labs and Tests Ordered: Current medicines are reviewed at length with the patient today.  Concerns regarding medicines are outlined above.  Medication changes, Labs and Tests ordered today are listed in the Patient Instructions below.  There are no Patient Instructions on file for this visit.   Signed, Fransico Him, MD  06/23/2016 8:13 AM    [facility] [address], [place], [place]  [address] Phone: [phone]; Fax: [phone]

## 2016-06-23 ENCOUNTER — Ambulatory Visit (INDEPENDENT_AMBULATORY_CARE_PROVIDER_SITE_OTHER): Payer: Medicare Other | Admitting: Cardiology

## 2016-06-23 ENCOUNTER — Encounter: Payer: Self-pay | Admitting: Cardiology

## 2016-06-23 VITALS — BP 100/60 | HR 69 | Ht 71.0 in | Wt 240.1 lb

## 2016-06-23 DIAGNOSIS — I4892 Unspecified atrial flutter: Secondary | ICD-10-CM | POA: Diagnosis not present

## 2016-06-23 DIAGNOSIS — I10 Essential (primary) hypertension: Secondary | ICD-10-CM

## 2016-06-23 DIAGNOSIS — G4733 Obstructive sleep apnea (adult) (pediatric): Secondary | ICD-10-CM | POA: Diagnosis not present

## 2016-06-23 DIAGNOSIS — I2510 Atherosclerotic heart disease of native coronary artery without angina pectoris: Secondary | ICD-10-CM | POA: Diagnosis not present

## 2016-06-23 DIAGNOSIS — E785 Hyperlipidemia, unspecified: Secondary | ICD-10-CM

## 2016-06-23 LAB — CBC
HEMATOCRIT: 41.4 % (ref 38.5–50.0)
Hemoglobin: 14.4 g/dL (ref 13.2–17.1)
MCH: 29.5 pg (ref 27.0–33.0)
MCHC: 34.8 g/dL (ref 32.0–36.0)
MCV: 84.8 fL (ref 80.0–100.0)
MPV: 9.8 fL (ref 7.5–12.5)
PLATELETS: 186 10*3/uL (ref 140–400)
RBC: 4.88 MIL/uL (ref 4.20–5.80)
RDW: 13.6 % (ref 11.0–15.0)
WBC: 7 10*3/uL (ref 3.8–10.8)

## 2016-06-23 LAB — BASIC METABOLIC PANEL
BUN: 30 mg/dL — AB (ref 7–25)
CALCIUM: 9.2 mg/dL (ref 8.6–10.3)
CO2: 26 mmol/L (ref 20–31)
CREATININE: 1.2 mg/dL — AB (ref 0.70–1.18)
Chloride: 102 mmol/L (ref 98–110)
GLUCOSE: 106 mg/dL — AB (ref 65–99)
POTASSIUM: 4.6 mmol/L (ref 3.5–5.3)
Sodium: 138 mmol/L (ref 135–146)

## 2016-06-23 LAB — LIPID PANEL
CHOLESTEROL: 94 mg/dL — AB (ref 125–200)
HDL: 40 mg/dL (ref >=40)
LDL Cholesterol: 31 mg/dL (ref <130)
TRIGLYCERIDES: 115 mg/dL (ref <150)
Total CHOL/HDL Ratio: 2.4 Ratio (ref <=5.0)
VLDL: 23 mg/dL (ref <30)

## 2016-06-23 LAB — HEPATIC FUNCTION PANEL
ALT: 21 U/L (ref 9–46)
AST: 20 U/L (ref 10–35)
Albumin: 4.4 g/dL (ref 3.6–5.1)
Alkaline Phosphatase: 59 U/L (ref 40–115)
BILIRUBIN DIRECT: 0.2 mg/dL (ref <=0.2)
Indirect Bilirubin: 0.4 mg/dL (ref 0.2–1.2)
Total Bilirubin: 0.6 mg/dL (ref 0.2–1.2)
Total Protein: 6.7 g/dL (ref 6.1–8.1)

## 2016-06-23 NOTE — Patient Instructions (Signed)
Medication Instructions:  None  Labwork: BMET, CBC, LFT, and Lipids today  Testing/Procedures: None  Follow-Up: Your physician wants you to follow-up in: 6 months with Dr. Radford Pax.  You will receive a reminder letter in the mail two months in advance. If you don't receive a letter, please call our office to schedule the follow-up appointment.   Any Other Special Instructions Will Be Listed Below (If Applicable).     If you need a refill on your cardiac medications before your next appointment, please call your pharmacy.

## 2016-06-23 NOTE — Addendum Note (Signed)
Addended by: Loren Racer on: 06/23/2016 02:07 PM   Modules accepted: Orders

## 2016-06-24 ENCOUNTER — Telehealth: Payer: Self-pay | Admitting: *Deleted

## 2016-06-24 DIAGNOSIS — R799 Abnormal finding of blood chemistry, unspecified: Secondary | ICD-10-CM

## 2016-06-24 MED ORDER — SPIRONOLACTONE 50 MG PO TABS: 50.0000 mg | ORAL_TABLET | Freq: Every day | ORAL | 3 refills | Status: DC

## 2016-06-24 NOTE — Telephone Encounter (Signed)
-----   Message from Sueanne Margarita, MD sent at 06/24/2016 10:57 AM EDT ----- Creatinine and BUN increased - decrease aldactone to 50mg  daily and repeat BMET in 1 week. Please have patient check his BP daily for a week and call with results.

## 2016-06-24 NOTE — Telephone Encounter (Signed)
Reviewed lab results with pt.  Went over new orders per Dr. Radford Pax.  Scheduled labs for 9/28.  Pt having colonoscopy 9/27 and wanted to wait until after.  Pt verbalized understanding and was in agreement with this plan.

## 2016-07-03 ENCOUNTER — Other Ambulatory Visit: Payer: Medicare Other | Admitting: *Deleted

## 2016-07-03 DIAGNOSIS — R799 Abnormal finding of blood chemistry, unspecified: Secondary | ICD-10-CM

## 2016-07-03 LAB — BASIC METABOLIC PANEL
BUN: 19 mg/dL (ref 7–25)
CO2: 24 mmol/L (ref 20–31)
Calcium: 9.2 mg/dL (ref 8.6–10.3)
Chloride: 106 mmol/L (ref 98–110)
Creat: 1.08 mg/dL (ref 0.70–1.18)
GLUCOSE: 121 mg/dL — AB (ref 65–99)
Potassium: 4.4 mmol/L (ref 3.5–5.3)
SODIUM: 140 mmol/L (ref 135–146)

## 2016-07-08 ENCOUNTER — Telehealth: Payer: Self-pay | Admitting: Cardiology

## 2016-07-08 NOTE — Telephone Encounter (Signed)
Walk In Pt Form-Vital Signs-dropped off [place] back Wednesday will give to her then.

## 2016-07-10 ENCOUNTER — Telehealth: Payer: Self-pay

## 2016-07-10 NOTE — Telephone Encounter (Signed)
Received blood pressure/HR log from patient: (dates unspecified)  Wednesday:  BP 116/59 HR 78 Thursday:      BP 131/65 HR 73 Friday:           BP 124/65 HR 75 Saturday:       BP 113/63 HR 75 Sunday:         BP 114/65 HR 69 Monday:         BP 109/62 HR 69 Tuesday:       BP  111/65 HR 67 Wednesday:  BP 122/74 Thursday:      BP 122/70 HR 75

## 2016-07-13 NOTE — Telephone Encounter (Signed)
BP well controlled after med change

## 2016-07-14 NOTE — Telephone Encounter (Signed)
Line busy

## 2016-07-15 NOTE — Telephone Encounter (Signed)
Informed patient that BP is well controlled and to continue current treatment plan. He was grateful for call.

## 2016-07-15 NOTE — Telephone Encounter (Signed)
Attempted to call - line is busy.  Will try again later.

## 2016-07-21 ENCOUNTER — Telehealth: Payer: Self-pay | Admitting: Cardiology

## 2016-07-21 NOTE — Telephone Encounter (Signed)
New Message  Pt c/o medication issue:  1. Name of Medication: Caudet  2. How are you currently taking this medication (dosage and times per day)? 10-80 mg tablet once daily  3. Are you having a reaction (difficulty breathing--STAT)? No  4. What is your medication issue? Pt voiced he's in the drug program and needs to reorder prescription Caudet from [employer].  Please f/u with pt

## 2016-07-22 NOTE — Telephone Encounter (Signed)
[name] 10/80 reordered through [employer]. Confirmation # [id number] should arrive in 7-10 days. Patient aware.

## 2016-07-30 ENCOUNTER — Telehealth: Payer: Self-pay

## 2016-07-30 NOTE — Telephone Encounter (Signed)
Patient's wife informed that [id number] 3 bottles has arrived today. Will place it at front desk for pickup.

## 2016-09-26 ENCOUNTER — Other Ambulatory Visit: Payer: Self-pay

## 2016-09-26 DIAGNOSIS — I4892 Unspecified atrial flutter: Secondary | ICD-10-CM

## 2016-09-26 MED ORDER — APIXABAN 5 MG PO TABS: 5.0000 mg | ORAL_TABLET | Freq: Two times a day (BID) | ORAL | 3 refills | Status: DC

## 2016-10-07 ENCOUNTER — Other Ambulatory Visit: Payer: Self-pay

## 2016-10-07 DIAGNOSIS — I4892 Unspecified atrial flutter: Secondary | ICD-10-CM

## 2016-10-07 MED ORDER — APIXABAN 5 MG PO TABS: 5.0000 mg | ORAL_TABLET | Freq: Two times a day (BID) | ORAL | 3 refills | Status: DC

## 2016-10-09 ENCOUNTER — Other Ambulatory Visit: Payer: Self-pay | Admitting: *Deleted

## 2016-10-09 DIAGNOSIS — I4892 Unspecified atrial flutter: Secondary | ICD-10-CM

## 2016-10-09 MED ORDER — APIXABAN 5 MG PO TABS: 5.0000 mg | ORAL_TABLET | Freq: Two times a day (BID) | ORAL | 3 refills | Status: DC

## 2016-10-15 ENCOUNTER — Telehealth: Payer: Self-pay

## 2016-10-15 NOTE — Telephone Encounter (Signed)
Patient assistance forms for Eliquis faxed to [employer].

## 2016-10-20 ENCOUNTER — Telehealth: Payer: Self-pay

## 2016-10-20 NOTE — Telephone Encounter (Signed)
[name] 10/80 re-ordered through [employer]. Confirmation # [id number].

## 2016-11-07 ENCOUNTER — Ambulatory Visit (INDEPENDENT_AMBULATORY_CARE_PROVIDER_SITE_OTHER): Payer: Medicare Other | Admitting: Ophthalmology

## 2016-11-07 DIAGNOSIS — H35033 Hypertensive retinopathy, bilateral: Secondary | ICD-10-CM

## 2016-11-07 DIAGNOSIS — H2513 Age-related nuclear cataract, bilateral: Secondary | ICD-10-CM

## 2016-11-07 DIAGNOSIS — H353132 Nonexudative age-related macular degeneration, bilateral, intermediate dry stage: Secondary | ICD-10-CM

## 2016-11-07 DIAGNOSIS — E113293 Type 2 diabetes mellitus with mild nonproliferative diabetic retinopathy without macular edema, bilateral: Secondary | ICD-10-CM | POA: Diagnosis not present

## 2016-11-07 DIAGNOSIS — E11319 Type 2 diabetes mellitus with unspecified diabetic retinopathy without macular edema: Secondary | ICD-10-CM | POA: Diagnosis not present

## 2016-11-07 DIAGNOSIS — I10 Essential (primary) hypertension: Secondary | ICD-10-CM

## 2016-11-07 DIAGNOSIS — H43813 Vitreous degeneration, bilateral: Secondary | ICD-10-CM | POA: Diagnosis not present

## 2016-12-18 ENCOUNTER — Encounter: Payer: Self-pay | Admitting: Cardiology

## 2016-12-22 ENCOUNTER — Encounter: Payer: Self-pay | Admitting: Cardiology

## 2016-12-22 ENCOUNTER — Ambulatory Visit (INDEPENDENT_AMBULATORY_CARE_PROVIDER_SITE_OTHER): Payer: Medicare Other | Admitting: Cardiology

## 2016-12-22 VITALS — BP 126/64 | HR 69 | Ht 71.0 in | Wt 247.4 lb

## 2016-12-22 DIAGNOSIS — I10 Essential (primary) hypertension: Secondary | ICD-10-CM

## 2016-12-22 DIAGNOSIS — I2510 Atherosclerotic heart disease of native coronary artery without angina pectoris: Secondary | ICD-10-CM

## 2016-12-22 DIAGNOSIS — G4733 Obstructive sleep apnea (adult) (pediatric): Secondary | ICD-10-CM | POA: Diagnosis not present

## 2016-12-22 DIAGNOSIS — I482 Chronic atrial fibrillation: Secondary | ICD-10-CM | POA: Diagnosis not present

## 2016-12-22 DIAGNOSIS — E7800 Pure hypercholesterolemia, unspecified: Secondary | ICD-10-CM | POA: Diagnosis not present

## 2016-12-22 DIAGNOSIS — I4821 Permanent atrial fibrillation: Secondary | ICD-10-CM

## 2016-12-22 LAB — BASIC METABOLIC PANEL
BUN / CREAT RATIO: 22 (ref 10–24)
BUN: 25 mg/dL (ref 8–27)
CO2: 22 mmol/L (ref 18–29)
CREATININE: 1.15 mg/dL (ref 0.76–1.27)
Calcium: 9.7 mg/dL (ref 8.6–10.2)
Chloride: 98 mmol/L (ref 96–106)
GFR, EST AFRICAN AMERICAN: 72 mL/min/{1.73_m2} (ref >59)
GFR, EST NON AFRICAN AMERICAN: 62 mL/min/{1.73_m2} (ref >59)
Glucose: 127 mg/dL — ABNORMAL HIGH (ref 65–99)
POTASSIUM: 5 mmol/L (ref 3.5–5.2)
SODIUM: 137 mmol/L (ref 134–144)

## 2016-12-22 LAB — HEPATIC FUNCTION PANEL
ALBUMIN: 4.6 g/dL (ref 3.5–4.8)
ALK PHOS: 86 IU/L (ref 39–117)
ALT: 27 IU/L (ref 0–44)
AST: 23 IU/L (ref 0–40)
BILIRUBIN, DIRECT: 0.16 mg/dL (ref 0.00–0.40)
Bilirubin Total: 0.5 mg/dL (ref 0.0–1.2)
Total Protein: 6.8 g/dL (ref 6.0–8.5)

## 2016-12-22 LAB — CBC WITH DIFFERENTIAL/PLATELET
BASOS: 0 %
Basophils Absolute: 0 10*3/uL (ref 0.0–0.2)
EOS (ABSOLUTE): 0.1 10*3/uL (ref 0.0–0.4)
EOS: 1 %
Hematocrit: 42.9 % (ref 37.5–51.0)
Hemoglobin: 14.5 g/dL (ref 13.0–17.7)
IMMATURE GRANULOCYTES: 0 %
Immature Grans (Abs): 0 10*3/uL (ref 0.0–0.1)
LYMPHS ABS: 1.6 10*3/uL (ref 0.7–3.1)
Lymphs: 22 %
MCH: 28.9 pg (ref 26.6–33.0)
MCHC: 33.8 g/dL (ref 31.5–35.7)
MCV: 86 fL (ref 79–97)
MONOS ABS: 0.7 10*3/uL (ref 0.1–0.9)
Monocytes: 9 %
NEUTROS ABS: 4.9 10*3/uL (ref 1.4–7.0)
Neutrophils: 68 %
PLATELETS: 197 10*3/uL (ref 150–379)
RBC: 5.02 x10E6/uL (ref 4.14–5.80)
RDW: 14.8 % (ref 12.3–15.4)
WBC: 7.4 10*3/uL (ref 3.4–10.8)

## 2016-12-22 LAB — LIPID PANEL
CHOLESTEROL TOTAL: 104 mg/dL (ref 100–199)
Chol/HDL Ratio: 2.8 ratio units (ref 0.0–5.0)
HDL: 37 mg/dL — AB (ref >39)
LDL Calculated: 44 mg/dL (ref 0–99)
Triglycerides: 115 mg/dL (ref 0–149)
VLDL CHOLESTEROL CAL: 23 mg/dL (ref 5–40)

## 2016-12-22 NOTE — Patient Instructions (Signed)
Medication Instructions:  Your physician recommends that you continue on your current medications as directed. Please refer to the Current Medication list given to you today.   Labwork: TODAY: BMET, CBC, LFTs, Lipids  Testing/Procedures: None  Follow-Up: Your physician wants you to follow-up in: 6 months with Dr. Turner. You will receive a reminder letter in the mail two months in advance. If you don't receive a letter, please call our office to schedule the follow-up appointment.   Any Other Special Instructions Will Be Listed Below (If Applicable).     If you need a refill on your cardiac medications before your next appointment, please call your pharmacy.   

## 2016-12-22 NOTE — Progress Notes (Signed)
Cardiology Office Note    Date:  12/22/2016   ID:  Austin Mcgrath, DOB 1941/05/17, MRN [id number]  PCP:  Lujean Amel, MD  Cardiologist:  Fransico Him, MD   Chief Complaint  Patient presents with  . Coronary Artery Disease  . Hypertension  . Sleep Apnea  . Atrial Flutter    History of Present Illness:  Austin Mcgrath is a 76 y.o. male with a history of nonobstructive ASCAD, HTN, OSA, paroxysmal atrial flutter on Eliquis and dyslipidemia who presents today for followup. He is tolerating his BiPAP well. He uses the nasal mask which he tolerates well. He feels the pressure is adequate. For the most part he feels rested in the am.  He is only getting up once nightly to go to the bathroom. He denies any chest pain or pressure, LE edema, dizziness, palpitations or syncope.  He has chronic SOB that is stable and has not changed since I saw him last.    Past Medical History:  Diagnosis Date  . Aortic valve sclerosis    No etenosis Echo 2012 Mild TR  . Arteriosclerotic coronary artery disease    nonobstructive/Mild AI -DR Radford Pax  . Atrial flutter, paroxysmal ([place]) 05/29/2015   CHADS2VASC score is 4 on chronic anticoaguation with DOAC  . BPH (benign prostatic hypertrophy)   . Degenerative disc disease    Dr Cyndy Freeze  . Diabetes mellitus without complication ([place])    Type II w/ microalbuminuria-Dr. Burnett Harry  . Dyslipidemia    elevated cholesterol-Dr Radford Pax  . Gout    Dr Justine Null  . 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 Hypertension   . Macular degeneration    mild hypertensive retinopaty,macular degeneration,referred to retina   . OA (osteoarthritis)    Dr Justine Null  . OSA (obstructive sleep apnea)    severe with AHI 43/hr now on BiPAP at 13/9cm H2O  . Screening for AAA (aortic abdominal aneurysm) 02/2012   No aneurysm  . TR (tricuspid regurgitation)    Mild  . Tubular adenoma of colon    colonoscopy 05/2011 repeat in 5 years -Dr. Watt Climes    Past Surgical History:  Procedure Laterality  Date  . APPENDECTOMY    . BUNIONECTOMY    . CARDIAC CATHETERIZATION N/A 04/27/2015   Procedure: Left Heart Cath and Coronary Angiography;  Surgeon: Jettie Booze, MD;  Location: [facility];  Service: Cardiovascular;  Laterality: N/A;  . foot sx  2006  . HERNIA REPAIR  Age 45  . L-Disk L5-S1 (right)  1993    Current Medications: Current Meds  Medication Sig  . allopurinol (ZYLOPRIM) 300 MG tablet Take 300 mg by mouth daily.  Marland Kitchen amLODipine-atorvastatin ([name]) 10-80 MG tablet Take 1 tablet by mouth daily.  Marland Kitchen apixaban (ELIQUIS) 5 MG TABS tablet Take 1 tablet (5 mg total) by mouth 2 (two) times daily.  . benazepril (LOTENSIN) 20 MG tablet Take 20 mg by mouth daily.  Marland Kitchen ibuprofen (ADVIL,MOTRIN) 200 MG tablet Take 400 mg by mouth as needed for moderate pain (arthritis).   . Multiple Vitamins-Minerals (ICAPS PO) Take 1 tablet by mouth daily.  . Omega 3 1200 MG CAPS Take 4 capsules by mouth daily.  Marland Kitchen spironolactone (ALDACTONE) 50 MG tablet Take 1 tablet (50 mg total) by mouth daily.    Allergies:   Contrast media [iodinated diagnostic agents]   Social History   Social History  . Marital status: Married    Spouse name: N/A  . Number of children: N/A  .  Years of education: N/A   Social History Main Topics  . Smoking status: Former Smoker    Quit date: 10/07/1971  . Smokeless tobacco: Never Used  . Alcohol use No  . Drug use: No  . Sexual activity: Not Asked   Other Topics Concern  . None   Social History Narrative  . None     Family History:  The patient's family history includes Cancer in his mother, sister, and sister; Hypertension in his brother and sister; Lung cancer in his mother.   ROS:   Please see the history of present illness.    ROS All other systems reviewed and are negative.  PAD Screen 12/22/2016  Previous PAD dx? No  Previous surgical procedure? No  Pain with walking? No  Feet/toe relief with dangling? No  Painful, non-healing ulcers? No    Extremities discolored? No       PHYSICAL EXAM:   VS:  BP 126/64   Pulse 69   Ht 5\' 11"  (1.803 m)   Wt 247 lb 6.4 oz (112.2 kg)   BMI 34.51 kg/m    GEN: Well nourished, well developed, in no acute distress  HEENT: normal  Neck: no JVD, carotid bruits, or masses Cardiac: RRR; no murmurs, rubs, or gallops,no edema.  Intact distal pulses bilaterally.  Respiratory:  clear to auscultation bilaterally, normal work of breathing GI: soft, nontender, nondistended, + BS MS: no deformity or atrophy  Skin: warm and dry, no Mcgrath Neuro:  Alert and Oriented x 3, Strength and sensation are intact Psych: euthymic mood, full affect  Wt Readings from Last 3 Encounters:  12/22/16 247 lb 6.4 oz (112.2 kg)  06/23/16 240 lb 1.9 oz (108.9 kg)  12/19/15 235 lb 6.4 oz (106.8 kg)      Studies/Labs Reviewed:   EKG:  EKG is not ordered today.   Recent Labs: 06/23/2016: ALT 21; Hemoglobin 14.4; Platelets 186 07/03/2016: BUN 19; Creat 1.08; Potassium 4.4; Sodium 140   Lipid Panel    Component Value Date/Time   CHOL 94 (L) 06/23/2016 0845   CHOL 111 10/28/2013 0813   TRIG 115 06/23/2016 0845   TRIG 146 10/28/2013 0813   HDL 40 06/23/2016 0845   HDL 37 (L) 10/28/2013 0813   CHOLHDL 2.4 06/23/2016 0845   VLDL 23 06/23/2016 0845   LDLCALC 31 06/23/2016 0845   LDLCALC 45 10/28/2013 0813    Additional studies/ records that were reviewed today include:  CPAP download    ASSESSMENT:    1. Permanent atrial fibrillation ([place])   2. Benign essential HTN   3. Atherosclerosis of native coronary artery of native heart without angina pectoris   4. OSA (obstructive sleep apnea)   5. Pure hypercholesterolemia      PLAN:  In order of problems listed above:  1.  Paroxysmal atrial flutter-  He is now in atrial fibrillation with CVR and is well rate controlled.  We are pursuing rate control at this point.   He will continue apixaban.  Will check BMET and CBC for chronic monitoring of  anticoagulant. 2.  HTN - BP controlled. Continue amlodipine/ACE I and spironolactone.   3.  ASCAD - nonobstructive.  He continues to remain free of anigina.   Continue statin.  No ASA due to DOAC. 4.  OSA - the patient is tolerating PAP therapy well without any problems. The PAP download was reviewed today and showed an AHI of 1.1/hr on 13/9 cm H2O with 97% compliance in using more than  4 hours nightly.  The patient has been using and benefiting from CPAP use and will continue to benefit from therapy.  5.  Hyperlipidemia with LDL goal < 70.  LDL was 31 on 06/23/2016. Continue statin.  Recheck FLP and ALT   Medication Adjustments/Labs and Tests Ordered: Current medicines are reviewed at length with the patient today.  Concerns regarding medicines are outlined above.  Medication changes, Labs and Tests ordered today are listed in the Patient Instructions below.  There are no Patient Instructions on file for this visit.   Signed, Fransico Him, MD  12/22/2016 8:48 AM    [address], [place], [place]  [address] Phone: [phone]; Fax: [phone]

## 2016-12-24 ENCOUNTER — Telehealth: Payer: Self-pay | Admitting: Cardiology

## 2016-12-24 DIAGNOSIS — I10 Essential (primary) hypertension: Secondary | ICD-10-CM

## 2016-12-24 MED ORDER — SPIRONOLACTONE 25 MG PO TABS: 25.0000 mg | ORAL_TABLET | Freq: Every day | ORAL | 11 refills | Status: DC

## 2016-12-24 NOTE — Telephone Encounter (Signed)
-----   Message from Sueanne Margarita, MD sent at 12/23/2016 12:51 PM EDT ----- Decrease aldactone to 25mg  daily and repeat BMET on 3/23.  Have patient check BP daily for a week and call with results

## 2016-12-24 NOTE — Telephone Encounter (Signed)
Instructed patient to DECREASE ALDACTONE to 25 mg daily. BMET scheduled Monday. He will check BP daily for a week and call with results. Patient agrees with treatment plan.

## 2016-12-24 NOTE — Telephone Encounter (Signed)
New Message      Returning [place] call about lab results

## 2016-12-29 ENCOUNTER — Other Ambulatory Visit: Payer: Medicare Other | Admitting: *Deleted

## 2016-12-29 DIAGNOSIS — I10 Essential (primary) hypertension: Secondary | ICD-10-CM

## 2016-12-30 LAB — BASIC METABOLIC PANEL
BUN/Creatinine Ratio: 17 (ref 10–24)
BUN: 17 mg/dL (ref 8–27)
CO2: 25 mmol/L (ref 18–29)
CREATININE: 1.02 mg/dL (ref 0.76–1.27)
Calcium: 9.1 mg/dL (ref 8.6–10.2)
Chloride: 99 mmol/L (ref 96–106)
GFR calc Af Amer: 83 mL/min/{1.73_m2} (ref >59)
GFR calc non Af Amer: 72 mL/min/{1.73_m2} (ref >59)
GLUCOSE: 128 mg/dL — AB (ref 65–99)
Potassium: 4.8 mmol/L (ref 3.5–5.2)
Sodium: 139 mmol/L (ref 134–144)

## 2017-01-15 ENCOUNTER — Telehealth: Payer: Self-pay | Admitting: Cardiology

## 2017-01-15 MED ORDER — AMLODIPINE-ATORVASTATIN 10-80 MG PO TABS: 1.0000 | ORAL_TABLET | Freq: Every day | ORAL | 6 refills | Status: DC

## 2017-01-15 NOTE — Telephone Encounter (Signed)
New message     *STAT* If patient is at the pharmacy, call can be transferred to refill team.   1. Which medications need to be refilled? (please list name of each medication and dose if known) caduet 10-80 mg  2. Which pharmacy/location (including street and city if local pharmacy) is medication to be sent to? Pt said it needs to be sent to the company who makes the drug, he's on a drug assistance program.   3. Do they need a 30 day or 90 day supply? 90 day

## 2017-01-15 NOTE — Telephone Encounter (Signed)
Looks like the pt applies for Pt assistance through [employer] for [name]. I have called the pts pharmacy, [facility], and asked them how much [name] will cost the pt for a month supply. I was asked to send in a refill request to [facility] for the pts [name] and to call them back in about 10 mins to get cost.  I have also left a message for the pt to call me back.

## 2017-01-15 NOTE — Telephone Encounter (Signed)
PT REQUESTED AMLODIPINE-ATORVASATIN, WE HAVE NOT FILLED THIS MEDICATION SINCE 11/06/15, PT SAID THAT HE GETS THE MEDICATION THROUGH [facility], WILL SEND TO [facility] AS PT SAID "DR TURNER'S RN OR LINDA R WOULD CALL MEDICATION INTO THE COMPANY THAT MAKES IT"

## 2017-01-15 NOTE — Telephone Encounter (Signed)
Follow Up: ° ° ° ° °Pt returning your call. °

## 2017-01-15 NOTE — Telephone Encounter (Signed)
**Note De-Identified Bernese Doffing Obfuscation** Per [employer] will cost the pt $207.30 for a 30 day supply.  Will offer the pt [employer] Pt assistance form when he returns call.

## 2017-01-15 NOTE — Telephone Encounter (Signed)
The pt is requesting that I mail his Pt assistance form to him through the mail.  I have placed [name] pt assistance form in our out going mail bin.  The pt states that once he completes form and gathers needed info he will drop off at this office and he is aware that I will fax to [employer].  I have filled out the provider portion of Pt assistance form and placed in Dr Landis Gandy folder to be signed.

## 2017-01-20 NOTE — Telephone Encounter (Signed)
I have faxed the pts Pt Assistance paperwork to [employer]. Awaiting response.

## 2017-02-06 NOTE — Telephone Encounter (Signed)
The pt has been approved for [name] pt assistance through [employer]. Approval good until 02/02/2018. ID#: [id number]

## 2017-02-09 NOTE — Telephone Encounter (Signed)
The pt is advised that [name] from [employer] pt assistance program is here at the office and that he can pick up. He states that he will pick up soon.

## 2017-04-20 ENCOUNTER — Telehealth: Payer: Self-pay | Admitting: Cardiology

## 2017-04-20 NOTE — Telephone Encounter (Signed)
New message      Pt is calling to ask Jeani Hawking to please order caduet from the drug company for him.  He is on the pt assistance program

## 2017-04-21 ENCOUNTER — Telehealth: Payer: Self-pay

## 2017-04-21 NOTE — Telephone Encounter (Signed)
I have ordered the pts [name] through [employer] over the phone with [name]. Per [name] the pts [name] will arrive within 7 to 10 business days. The pt is aware.

## 2017-04-24 NOTE — Telephone Encounter (Signed)
**Note De-Identified Karis Rilling Obfuscation** See phone note from 7/17.

## 2017-04-27 NOTE — Telephone Encounter (Signed)
The pts wife Vickii Chafe is advised that the pts [name] arrived by mail today and that he may pick it up from the front office at his convenience. She verbalized understanding.

## 2017-05-19 ENCOUNTER — Ambulatory Visit (INDEPENDENT_AMBULATORY_CARE_PROVIDER_SITE_OTHER): Payer: Medicare Other | Admitting: Ophthalmology

## 2017-05-19 DIAGNOSIS — H43813 Vitreous degeneration, bilateral: Secondary | ICD-10-CM

## 2017-05-19 DIAGNOSIS — H35033 Hypertensive retinopathy, bilateral: Secondary | ICD-10-CM | POA: Diagnosis not present

## 2017-05-19 DIAGNOSIS — H353132 Nonexudative age-related macular degeneration, bilateral, intermediate dry stage: Secondary | ICD-10-CM

## 2017-05-19 DIAGNOSIS — I10 Essential (primary) hypertension: Secondary | ICD-10-CM | POA: Diagnosis not present

## 2017-05-19 DIAGNOSIS — E113293 Type 2 diabetes mellitus with mild nonproliferative diabetic retinopathy without macular edema, bilateral: Secondary | ICD-10-CM | POA: Diagnosis not present

## 2017-05-19 DIAGNOSIS — E11319 Type 2 diabetes mellitus with unspecified diabetic retinopathy without macular edema: Secondary | ICD-10-CM

## 2017-07-03 ENCOUNTER — Encounter: Payer: Self-pay | Admitting: Cardiology

## 2017-07-09 ENCOUNTER — Encounter: Payer: Self-pay | Admitting: Cardiology

## 2017-07-14 ENCOUNTER — Ambulatory Visit (INDEPENDENT_AMBULATORY_CARE_PROVIDER_SITE_OTHER): Payer: Medicare Other | Admitting: Cardiology

## 2017-07-14 ENCOUNTER — Telehealth: Payer: Self-pay

## 2017-07-14 ENCOUNTER — Other Ambulatory Visit: Payer: Self-pay | Admitting: Cardiology

## 2017-07-14 ENCOUNTER — Encounter: Payer: Self-pay | Admitting: Cardiology

## 2017-07-14 VITALS — BP 110/60 | HR 76 | Ht 71.0 in | Wt 250.1 lb

## 2017-07-14 DIAGNOSIS — E7800 Pure hypercholesterolemia, unspecified: Secondary | ICD-10-CM | POA: Diagnosis not present

## 2017-07-14 DIAGNOSIS — I739 Peripheral vascular disease, unspecified: Secondary | ICD-10-CM

## 2017-07-14 DIAGNOSIS — I2510 Atherosclerotic heart disease of native coronary artery without angina pectoris: Secondary | ICD-10-CM

## 2017-07-14 DIAGNOSIS — G4733 Obstructive sleep apnea (adult) (pediatric): Secondary | ICD-10-CM

## 2017-07-14 DIAGNOSIS — R0602 Shortness of breath: Secondary | ICD-10-CM | POA: Diagnosis not present

## 2017-07-14 DIAGNOSIS — I482 Chronic atrial fibrillation: Secondary | ICD-10-CM | POA: Diagnosis not present

## 2017-07-14 DIAGNOSIS — I10 Essential (primary) hypertension: Secondary | ICD-10-CM | POA: Diagnosis not present

## 2017-07-14 DIAGNOSIS — I4821 Permanent atrial fibrillation: Secondary | ICD-10-CM

## 2017-07-14 LAB — CBC
Hematocrit: 40.6 % (ref 37.5–51.0)
Hemoglobin: 14.3 g/dL (ref 13.0–17.7)
MCH: 29.1 pg (ref 26.6–33.0)
MCHC: 35.2 g/dL (ref 31.5–35.7)
MCV: 83 fL (ref 79–97)
PLATELETS: 193 10*3/uL (ref 150–379)
RBC: 4.91 x10E6/uL (ref 4.14–5.80)
RDW: 13.9 % (ref 12.3–15.4)
WBC: 7.5 10*3/uL (ref 3.4–10.8)

## 2017-07-14 LAB — BASIC METABOLIC PANEL
BUN / CREAT RATIO: 18 (ref 10–24)
BUN: 19 mg/dL (ref 8–27)
CO2: 23 mmol/L (ref 20–29)
CREATININE: 1.04 mg/dL (ref 0.76–1.27)
Calcium: 9.3 mg/dL (ref 8.6–10.2)
Chloride: 102 mmol/L (ref 96–106)
GFR calc Af Amer: 81 mL/min/{1.73_m2} (ref >59)
GFR, EST NON AFRICAN AMERICAN: 70 mL/min/{1.73_m2} (ref >59)
Glucose: 127 mg/dL — ABNORMAL HIGH (ref 65–99)
Potassium: 4.4 mmol/L (ref 3.5–5.2)
SODIUM: 140 mmol/L (ref 134–144)

## 2017-07-14 NOTE — Progress Notes (Signed)
Cardiology Office Note:    Date:  07/14/2017   ID:  Austin Mcgrath, DOB 22-Jan-1941, MRN [id number]  PCP:  Lujean Amel, MD  Cardiologist:  Fransico Him, MD   Referring MD: Lujean Amel, MD   Chief Complaint  Patient presents with  . Coronary Artery Disease  . Atrial Fibrillation  . Hyperlipidemia    History of Present Illness:    Austin Mcgrath is a 76 y.o. 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  He is here today for followup and is doing well.  He denies any chest pain or pressure,  PND, orthopnea, LE edema, dizziness, palpitations or syncope. He is very sedentary so he notices that he gets more tired than usual when he exerts himself as well as gets SOB from it. He complains of getting cramps in his calves and tingling in his feet when he walks.   He is compliant with his meds and is tolerating meds with no SE.    Past Medical History:  Diagnosis Date  . Aortic valve sclerosis    No etenosis Echo 2012 Mild TR  . Arteriosclerotic coronary artery disease    nonobstructive/Mild AI -DR Radford Pax  . Atrial flutter, paroxysmal (Austin Mcgrath) 05/29/2015   CHADS2VASC score is 4 on chronic anticoaguation with DOAC  . BPH (benign prostatic hypertrophy)   . Degenerative disc disease    Dr Cyndy Freeze  . Diabetes mellitus without complication ([place])    Type II w/ microalbuminuria-Dr. Burnett Harry  . Dyslipidemia    elevated cholesterol-Dr Radford Pax  . Gout    Dr Justine Null  . 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 Hypertension   . Macular degeneration    mild hypertensive retinopaty,macular degeneration,referred to retina   . OA (osteoarthritis)    Dr Justine Null  . OSA (obstructive sleep apnea)    severe with AHI 43/hr now on BiPAP at 13/9cm H2O  . Screening for AAA (aortic abdominal aneurysm) 02/2012   No aneurysm  . TR (tricuspid regurgitation)    Mild  . Tubular adenoma of colon    colonoscopy 05/2011 repeat in 5 years -Dr. Watt Climes    Past  Surgical History:  Procedure Laterality Date  . APPENDECTOMY    . BUNIONECTOMY    . CARDIAC CATHETERIZATION N/A 04/27/2015   Procedure: Left Heart Cath and Coronary Angiography;  Surgeon: Jettie Booze, MD;  Location: [facility];  Service: Cardiovascular;  Laterality: N/A;  . foot sx  2006  . HERNIA REPAIR  Age 29  . L-Disk L5-S1 (right)  1993    Current Medications: Current Meds  Medication Sig  . acetaminophen (TYLENOL) 325 MG tablet Take 650 mg by mouth every 6 (six) hours as needed.  Marland Kitchen allopurinol (ZYLOPRIM) 300 MG tablet Take 300 mg by mouth daily.  Marland Kitchen amLODipine-atorvastatin ([name]) 10-80 MG tablet Take 1 tablet by mouth daily.  Marland Kitchen apixaban (ELIQUIS) 5 MG TABS tablet Take 1 tablet (5 mg total) by mouth 2 (two) times daily.  . benazepril (LOTENSIN) 20 MG tablet Take 20 mg by mouth daily.  . Multiple Vitamins-Minerals (ICAPS PO) Take 1 tablet by mouth daily.  . Omega 3 1200 MG CAPS Take 4 capsules by mouth daily.  Marland Kitchen spironolactone (ALDACTONE) 25 MG tablet Take 1 tablet (25 mg total) by mouth daily.     Allergies:   Contrast media [iodinated diagnostic agents]   Social History   Social History  . Marital status:  Married    Spouse name: N/A  . Number of children: N/A  . Years of education: N/A   Social History Main Topics  . Smoking status: Former Smoker    Quit date: 10/07/1971  . Smokeless tobacco: Never Used  . Alcohol use No  . Drug use: No  . Sexual activity: Not Asked   Other Topics Concern  . None   Social History Narrative  . None     Family History: The patient's family history includes Cancer in his mother, sister, and sister; Hypertension in his brother and sister; Lung cancer in his mother. There is no history of Heart attack or Stroke.  ROS:   Please see the history of present illness.    ROS  Back pain, constipationAll other systems reviewed and negative.   EKGs/Labs/Other Studies Reviewed:    The following studies were reviewed  today: none  EKG:  EKG is not ordered today.    Recent Labs: 12/22/2016: ALT 27; Hemoglobin 14.5; Platelets 197 12/29/2016: BUN 17; Creatinine, Ser 1.02; Potassium 4.8; Sodium 139   Recent Lipid Panel    Component Value Date/Time   CHOL 104 12/22/2016 0906   CHOL 111 10/28/2013 0813   TRIG 115 12/22/2016 0906   TRIG 146 10/28/2013 0813   HDL 37 (L) 12/22/2016 0906   HDL 37 (L) 10/28/2013 0813   CHOLHDL 2.8 12/22/2016 0906   CHOLHDL 2.4 06/23/2016 0845   VLDL 23 06/23/2016 0845   LDLCALC 44 12/22/2016 0906   LDLCALC 45 10/28/2013 0813    Physical Exam:    VS:  BP 110/60   Pulse 76   Ht 5\' 11"  (1.803 m)   Wt 250 lb 1.9 oz (113.5 kg)   SpO2 98%   BMI 34.88 kg/m     Wt Readings from Last 3 Encounters:  07/14/17 250 lb 1.9 oz (113.5 kg)  12/22/16 247 lb 6.4 oz (112.2 kg)  06/23/16 240 lb 1.9 oz (108.9 kg)     GEN:  Well nourished, well developed in no acute distress HEENT: Normal NECK: No JVD; No carotid bruits LYMPHATICS: No lymphadenopathy CARDIAC: RRR, no murmurs, rubs, gallops RESPIRATORY:  Clear to auscultation without rales, wheezing or rhonchi  ABDOMEN: Soft, non-tender, non-distended MUSCULOSKELETAL:  No edema; No deformity  SKIN: Warm and dry NEUROLOGIC:  Alert and oriented x 3 PSYCHIATRIC:  Normal affect   ASSESSMENT:    1. Atherosclerosis of native coronary artery of native heart without angina pectoris   2. Benign essential HTN   3. Permanent atrial fibrillation (HCC)   4. OSA (obstructive sleep apnea)   5. Pure hypercholesterolemia   6. Claudication [facility])    PLAN:    In order of problems listed above:  1.  Nonobstructive ASCAD - he has no angina symptoms today but is complaining of increased SOB and exertional fatigue which I think is likely due to sedentary state.  He had a cath done 2 years ago with minimal nonobstructive disease.  I will check a 2D echo to assess LVF.Marland Kitchen  He is not on ASA due to NOAC.  He will continue on statin.    2.  HTN -  BP is well controlled on exam today.  He will continue on amlodipine 10mg  daily, spinronolactone 25mg  daily and benazepril 20mg  daily.    3.  Permanent atrial fibrillation - He HR is well controlled.  He will continue on apixaban 5mg  BID. I will check a BMET and CBC today.  4.  OSA - the  patient is tolerating PAP therapy well without any problems. The PAP download was reviewed today and showed an AHI of 2.4/hr on 13/9 cm H2O with 97% compliance in using more than 4 hours nightly.  The patient has been using and benefiting from CPAP use and will continue to benefit from therapy.   5.  Hyperlipidemia with LDL goal < 70.  He will continue on atorvastatin 80mg  daily.  His last LDL was at goal at 44.    6.  Leg cramps - somewhat vague for claudication but does occur with walking.  He has 1+ pulses in the LE.  I will get ABIs with rest and exercise.      Medication Adjustments/Labs and Tests Ordered: Current medicines are reviewed at length with the patient today.  Concerns regarding medicines are outlined above.  No orders of the defined types were placed in this encounter.  No orders of the defined types were placed in this encounter.   Signed, Fransico Him, MD  07/14/2017 9:05 AM    [place]

## 2017-07-14 NOTE — Patient Instructions (Addendum)
Medication Instructions:  Your physician recommends that you continue on your current medications as directed. Please refer to the Current Medication list given to you today.   Labwork: BMET and CBC today  Testing/Procedures: Your physician has requested that you have an echocardiogram. Echocardiography is a painless test that uses sound waves to create images of your heart. It provides your doctor with information about the size and shape of your heart and how well your heart's chambers and valves are working. This procedure takes approximately one hour. There are no restrictions for this procedure.  Your physician has requested that you have a lower extremity arterial exercise duplex. During this test, exercise and ultrasound are used to evaluate arterial blood flow in the legs. Allow one hour for this exam. There are no restrictions or special instructions.   Follow-Up: Your physician wants you to follow-up in: 6 months with Dr. Radford Pax.  You will receive a reminder letter in the mail two months in advance. If you don't receive a letter, please call our office to schedule the follow-up appointment.   Any Other Special Instructions Will Be Listed Below (If Applicable).     If you need a refill on your cardiac medications before your next appointment, please call your pharmacy.

## 2017-07-14 NOTE — Telephone Encounter (Signed)
**Note De-Identified Austin Mcgrath Obfuscation** The pt had an OV with Dr Radford Pax today and while here requested that we order [name] from [place] pt assistance at [phone]. I have ordered [name] and per [employer] it will arrive at the office Lidia Clavijo mail with in 7 to 10 business days.

## 2017-07-21 ENCOUNTER — Other Ambulatory Visit: Payer: Self-pay

## 2017-07-21 ENCOUNTER — Telehealth: Payer: Self-pay

## 2017-07-21 ENCOUNTER — Ambulatory Visit (HOSPITAL_COMMUNITY): Payer: Medicare Other | Attending: Cardiovascular Disease

## 2017-07-21 DIAGNOSIS — R0602 Shortness of breath: Secondary | ICD-10-CM

## 2017-07-21 DIAGNOSIS — G4733 Obstructive sleep apnea (adult) (pediatric): Secondary | ICD-10-CM | POA: Insufficient documentation

## 2017-07-21 DIAGNOSIS — I4891 Unspecified atrial fibrillation: Secondary | ICD-10-CM | POA: Insufficient documentation

## 2017-07-21 NOTE — Telephone Encounter (Signed)
Patient made aware of Dr. Theodosia Blender recommendations follow up test for PFTs with DLCO and VQ scan due to SOB. Patient verbalized understanding of follow up tests and notified that he will be contacted with future appointment. Pt thankful for call

## 2017-07-21 NOTE — Telephone Encounter (Signed)
-----   Message from Sueanne Margarita, MD sent at 07/21/2017  1:42 PM EDT ----- Please check PFTs with DLCO and VQ scan

## 2017-07-27 NOTE — Telephone Encounter (Signed)
Spoke with patients wife Vickii Chafe, okay per DPR, to let her know that her husbands caduet had arrived from the company. She is aware that I will place it at the front desk for them to pick up at their convenience. Wife verbalized her understanding and thanked me for the call.

## 2017-07-29 ENCOUNTER — Ambulatory Visit (HOSPITAL_COMMUNITY)
Admission: RE | Admit: 2017-07-29 | Discharge: 2017-07-29 | Disposition: A | Discharge status: Home / Self Care | Payer: Medicare Other | Source: Ambulatory Visit | Attending: Cardiology | Admitting: Cardiology

## 2017-07-29 ENCOUNTER — Encounter (HOSPITAL_COMMUNITY)
Admission: RE | Admit: 2017-07-29 | Discharge: 2017-07-29 | Disposition: A | Discharge status: Home / Self Care | Payer: Medicare Other | Source: Ambulatory Visit | Attending: Cardiology | Admitting: Cardiology

## 2017-07-29 DIAGNOSIS — R0602 Shortness of breath: Secondary | ICD-10-CM

## 2017-07-29 LAB — PULMONARY FUNCTION TEST
DL/VA % PRED: 117 %
DL/VA: 5.46 ml/min/mmHg/L
DLCO UNC: 30.84 ml/min/mmHg
DLCO unc % pred: 91 %
FEF 25-75 POST: 3.36 L/s
FEF 25-75 Pre: 2.98 L/sec
FEF2575-%Change-Post: 12 %
FEF2575-%PRED-POST: 144 %
FEF2575-%Pred-Pre: 128 %
FEV1-%CHANGE-POST: 2 %
FEV1-%PRED-PRE: 96 %
FEV1-%Pred-Post: 98 %
FEV1-Post: 3.17 L
FEV1-Pre: 3.11 L
FEV1FVC-%Change-Post: 3 %
FEV1FVC-%PRED-PRE: 109 %
FEV6-%Change-Post: 0 %
FEV6-%Pred-Post: 92 %
FEV6-%Pred-Pre: 91 %
FEV6-Post: 3.83 L
FEV6-Pre: 3.79 L
FEV6FVC-%Change-Post: 1 %
FEV6FVC-%Pred-Post: 106 %
FEV6FVC-%Pred-Pre: 104 %
FVC-%Change-Post: -1 %
FVC-%PRED-POST: 86 %
FVC-%PRED-PRE: 87 %
FVC-PRE: 3.88 L
FVC-Post: 3.83 L
POST FEV1/FVC RATIO: 83 %
PRE FEV6/FVC RATIO: 98 %
Post FEV6/FVC ratio: 100 %
Pre FEV1/FVC ratio: 80 %
RV % pred: 96 %
RV: 2.5 L
TLC % pred: 89 %
TLC: 6.46 L

## 2017-07-29 MED ORDER — TECHNETIUM TC 99M DIETHYLENETRIAME-PENTAACETIC ACID
30.0000 | Freq: Once | INTRAVENOUS | Status: AC | PRN
  Administered 2017-07-29: 30 via INTRAVENOUS

## 2017-07-29 MED ORDER — TECHNETIUM TO 99M ALBUMIN AGGREGATED
4.0000 | Freq: Once | INTRAVENOUS | Status: AC | PRN
  Administered 2017-07-29: 4 via INTRAVENOUS

## 2017-07-29 MED ORDER — ALBUTEROL SULFATE (2.5 MG/3ML) 0.083% IN NEBU
2.5000 mg | INHALATION_SOLUTION | Freq: Once | RESPIRATORY_TRACT | Status: AC
  Administered 2017-07-29: 2.5 mg via RESPIRATORY_TRACT

## 2017-07-29 MED ORDER — TECHNETIUM TC 99M DIETHYLENETRIAME-PENTAACETIC ACID: 30.0000 | Freq: Once | INTRAVENOUS | Status: DC | PRN

## 2017-07-31 ENCOUNTER — Telehealth: Payer: Self-pay | Admitting: Cardiology

## 2017-07-31 ENCOUNTER — Ambulatory Visit (HOSPITAL_COMMUNITY)
Admission: RE | Admit: 2017-07-31 | Discharge: 2017-07-31 | Disposition: A | Discharge status: Home / Self Care | Payer: Medicare Other | Source: Ambulatory Visit | Attending: Cardiology | Admitting: Cardiology

## 2017-07-31 DIAGNOSIS — I739 Peripheral vascular disease, unspecified: Secondary | ICD-10-CM | POA: Diagnosis not present

## 2017-07-31 NOTE — Telephone Encounter (Signed)
New message ° ° ° ° °Patient returning call for results. Please call °

## 2017-07-31 NOTE — Telephone Encounter (Deleted)
-----   Message from Sueanne Margarita, MD sent at 07/30/2017  2:00 PM EDT ----- Please let patient know that labs were normal.  Continue current medical therapy.

## 2017-07-31 NOTE — Telephone Encounter (Signed)
Notes recorded by Sueanne Margarita, MD on 07/30/2017 at 2:00 PM EDT No PE

## 2017-07-31 NOTE — Telephone Encounter (Signed)
Notes recorded by Sueanne Margarita, MD on 07/29/2017 at 4:28 PM EDT normal PFTs

## 2017-07-31 NOTE — Telephone Encounter (Signed)
Patient called and notified of PFT and VQ Scan results. Patient verbalized understanding and thanked me for the call.

## 2017-10-12 ENCOUNTER — Telehealth: Payer: Self-pay | Admitting: Cardiology

## 2017-10-12 MED ORDER — AMLODIPINE-ATORVASTATIN 10-80 MG PO TABS: 1.0000 | ORAL_TABLET | Freq: Every day | ORAL | 2 refills | Status: DC

## 2017-10-12 NOTE — Telephone Encounter (Signed)
Pt's medication was sent to pt's pharmacy as requested. Confirmation received.  °

## 2017-10-12 NOTE — Telephone Encounter (Signed)
New emssage   *STAT* If patient is at the pharmacy, call can be transferred to refill team.   1. Which medications need to be refilled? (please list name of each medication and dose if known) caduet 10-80mg    2. Which pharmacy/location (including street and city if local pharmacy) is medication to be sent to? [facility] [address]   3. Do they need a 30 day or 90 day supply? [place]

## 2017-10-19 ENCOUNTER — Telehealth: Payer: Self-pay | Admitting: Cardiology

## 2017-10-19 ENCOUNTER — Encounter: Payer: Self-pay | Admitting: Cardiology

## 2017-10-19 NOTE — Telephone Encounter (Signed)
New message    Pt came in about to speak with Jeani Hawking about his caduet medication assistance. He said that it was sent to [facility] instead of the company for pt assistance. He has 2 weeks of medication left. Please call pt.

## 2017-10-19 NOTE — Telephone Encounter (Signed)
F/u call: Patient returning call for [name] medication assistance

## 2017-10-19 NOTE — Telephone Encounter (Signed)
See patient email from 10/19/17.  [id number] ID: [id number]

## 2017-10-26 ENCOUNTER — Telehealth: Payer: Self-pay | Admitting: *Deleted

## 2017-10-26 NOTE — Telephone Encounter (Signed)
Spoke with patient and made him aware that his caduet had arrived from the company and that I would place it at the front desk for him to pick up at his convenience. He verbalized his understanding and thanked me for the call.

## 2017-11-20 ENCOUNTER — Encounter (INDEPENDENT_AMBULATORY_CARE_PROVIDER_SITE_OTHER): Payer: Medicare Other | Admitting: Ophthalmology

## 2017-11-20 DIAGNOSIS — I10 Essential (primary) hypertension: Secondary | ICD-10-CM

## 2017-11-20 DIAGNOSIS — H2513 Age-related nuclear cataract, bilateral: Secondary | ICD-10-CM

## 2017-11-20 DIAGNOSIS — H35033 Hypertensive retinopathy, bilateral: Secondary | ICD-10-CM

## 2017-11-20 DIAGNOSIS — H43813 Vitreous degeneration, bilateral: Secondary | ICD-10-CM

## 2017-11-20 DIAGNOSIS — H353132 Nonexudative age-related macular degeneration, bilateral, intermediate dry stage: Secondary | ICD-10-CM

## 2018-01-18 ENCOUNTER — Telehealth: Payer: Self-pay

## 2018-01-18 NOTE — Telephone Encounter (Signed)
**Note De-Identified Alexsandro Salek Obfuscation** The pt called the office and requested that I order [name] from [employer] pt assistance. I called [employer] at [phone] and s/w [place]. Per Gerilyn Nestle the pts assistance is due for renewal. She states that she will mail a pt a pt assistance application to the pts address. He will need to supply his 2018 proof of income and a copy of his insurance cards.  I called the pt and made him aware that it is time for him to re-apply for pt assistance for [name] through [employer] and that they are mailing him an application. While on the phone the pt stated that it is time for him to re-apply for Pt assistance through [employer] for his Eliquis.  I have advised him that I will mail him a [employer] Pt assistance application to his home address for him to complete and to return both [place] and [employer] pt assistance applications to me along with his 2018 proof of income and his 3% out of pocket expense report from his pharmacy.  He verbalized understanding and thanked me for my assistance.

## 2018-01-25 ENCOUNTER — Other Ambulatory Visit: Payer: Self-pay

## 2018-01-25 DIAGNOSIS — I4892 Unspecified atrial flutter: Secondary | ICD-10-CM

## 2018-01-25 MED ORDER — AMLODIPINE-ATORVASTATIN 10-80 MG PO TABS: 1.0000 | ORAL_TABLET | Freq: Every day | ORAL | 3 refills | Status: DC

## 2018-01-25 MED ORDER — APIXABAN 5 MG PO TABS: 5.0000 mg | ORAL_TABLET | Freq: Two times a day (BID) | ORAL | 3 refills | Status: DC

## 2018-01-25 NOTE — Telephone Encounter (Signed)
The pt returned his completed [name] and Eliquis pt assistance applications to the office.   I have completed the providers part on each application and placed them in Dr Landis Gandy mail bin awaiting her signature.

## 2018-01-25 NOTE — Telephone Encounter (Signed)
Dr Radford Pax has signed both application and I have faxed them to [place] ([name]) and [place] (Eliquis).

## 2018-01-28 ENCOUNTER — Encounter: Payer: Self-pay | Admitting: Cardiology

## 2018-02-02 NOTE — Telephone Encounter (Signed)
**Note De-Identified Austin Mcgrath Obfuscation** I have called the pt and made him aware that [name] has arrived in the office from [employer] pt assistance program. He verbalized understanding and thanked me for my call.

## 2018-02-09 ENCOUNTER — Ambulatory Visit: Payer: Medicare Other | Admitting: Physician Assistant

## 2018-02-10 ENCOUNTER — Encounter: Payer: Self-pay | Admitting: *Deleted

## 2018-02-10 NOTE — Progress Notes (Signed)
Cardiology Office Note    Date:  02/11/2018   ID:  Austin Mcgrath, DOB 1940-10-14, MRN [id number]  PCP:  Lujean Amel, MD  Cardiologist: Fransico Him, MD  Chief Complaint  Patient presents with  . Follow-up    History of Present Illness:  Austin Mcgrath is a 77 y.o. male with history of nonobstructive CAD on cath in 2016, PAF on Eliquis, hypertension, OSA, dyslipidemia.  Saw Dr. Radford Pax 07/2017 at which time he was complaining of increased shortness of breath and exertional fatigue felt secondary to sedentary state.  Was complaining of leg cramps with walking and ABIs done with and without exercise were within normal range.  2D echo normal, VQ scan normal no evidence of PE.  Patient comes in today for six-month follow-up.  His main complaint is trouble getting supplies for his CPAP machine.  He has not felt any better since starting CPAP 2 years ago.  He denies any chest pain, dyspnea, dyspnea on exertion, dizziness or presyncope.  He also has a lot of drainage and cough in the morning.  He is never been diagnosed with allergies.    Past Medical History:  Diagnosis Date  . Aortic valve sclerosis    No etenosis Echo 2012 Mild TR  . Arteriosclerotic coronary artery disease    nonobstructive/Mild AI -DR Radford Pax  . Atrial flutter, paroxysmal ([place]) 05/29/2015   CHADS2VASC score is 4 on chronic anticoaguation with DOAC  . BPH (benign prostatic hypertrophy)   . Degenerative disc disease    Dr Cyndy Freeze  . Diabetes mellitus without complication ([place])    Type II w/ microalbuminuria-Dr. Burnett Harry  . Dyslipidemia    elevated cholesterol-Dr Radford Pax  . Gout    Dr Justine Null  . 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 Hypertension   . Macular degeneration    mild hypertensive retinopaty,macular degeneration,referred to retina   . OA (osteoarthritis)    Dr Justine Null  . OSA (obstructive sleep apnea)    severe with AHI 43/hr now on BiPAP at 13/9cm H2O  . Screening for AAA (aortic abdominal aneurysm) 02/2012   No  aneurysm  . TR (tricuspid regurgitation)    Mild  . Tubular adenoma of colon    colonoscopy 05/2011 repeat in 5 years -Dr. Watt Climes    Past Surgical History:  Procedure Laterality Date  . APPENDECTOMY    . BUNIONECTOMY    . CARDIAC CATHETERIZATION N/A 04/27/2015   Procedure: Left Heart Cath and Coronary Angiography;  Surgeon: Jettie Booze, MD;  Location: [facility];  Service: Cardiovascular;  Laterality: N/A;  . foot sx  2006  . HERNIA REPAIR  Age 61  . L-Disk L5-S1 (right)  1993    Current Medications: Current Meds  Medication Sig  . acetaminophen (TYLENOL) 325 MG tablet Take 650 mg by mouth every 6 (six) hours as needed.  Marland Kitchen allopurinol (ZYLOPRIM) 300 MG tablet Take 300 mg by mouth daily.  Marland Kitchen amLODipine-atorvastatin ([name]) 10-80 MG tablet Take 1 tablet by mouth daily.  Marland Kitchen apixaban (ELIQUIS) 5 MG TABS tablet Take 1 tablet (5 mg total) by mouth 2 (two) times daily.  . benazepril (LOTENSIN) 20 MG tablet Take 20 mg by mouth daily.  . Multiple Vitamins-Minerals (ICAPS PO) Take 1 tablet by mouth daily.  . Omega 3 1200 MG CAPS Take 4 capsules by mouth daily.  Marland Kitchen spironolactone (ALDACTONE) 25 MG tablet Take 1 tablet (25 mg total) by mouth daily.     Allergies:   Contrast media [  iodinated diagnostic agents]   Social History   Socioeconomic History  . Marital status: Married    Spouse name: Not on file  . Number of children: Not on file  . Years of education: Not on file  . Highest education level: Not on file  Occupational History  . Not on file  Social Needs  . Financial resource strain: Not on file  . Food insecurity:    Worry: Not on file    Inability: Not on file  . Transportation needs:    Medical: Not on file    Non-medical: Not on file  Tobacco Use  . Smoking status: Former Smoker    Last attempt to quit: 10/07/1971    Years since quitting: 46.3  . Smokeless tobacco: Never Used  Substance and Sexual Activity  . Alcohol use: No  . Drug use: No  . Sexual  activity: Not on file  Lifestyle  . Physical activity:    Days per week: Not on file    Minutes per session: Not on file  . Stress: Not on file  Relationships  . Social connections:    Talks on phone: Not on file    Gets together: Not on file    Attends religious service: Not on file    Active member of club or organization: Not on file    Attends meetings of clubs or organizations: Not on file    Relationship status: Not on file  Other Topics Concern  . Not on file  Social History Narrative  . Not on file     Family History:  The patient's family history includes Breast cancer in his sister; Colon cancer in his sister; Hypertension in his brother and sister; Lung cancer in his mother.   ROS:   Please see the history of present illness.    Review of Systems  Constitution: Negative.  HENT: Positive for hearing loss.   Cardiovascular: Negative.   Respiratory: Positive for cough.   Endocrine: Negative.   Hematologic/Lymphatic: Negative.   Musculoskeletal: Positive for back pain.  Gastrointestinal: Positive for constipation.  Genitourinary: Negative.   Neurological: Negative.    All other systems reviewed and are negative.   PHYSICAL EXAM:   VS:  BP 112/68 (BP Location: Right Arm, Patient Position: Sitting, Cuff Size: Normal)   Pulse 69   Ht 5\' 11"  (1.803 m)   Wt 251 lb (113.9 kg)   SpO2 97%   BMI 35.01 kg/m   Physical Exam  GEN: Well nourished, well developed, in no acute distress  Neck: no JVD, carotid bruits, or masses Cardiac: Irregular irregular Respiratory:  clear to auscultation bilaterally, normal work of breathing GI: soft, nontender, nondistended, + BS Ext: without cyanosis, clubbing, or edema, Good distal pulses bilaterally Neuro:  Alert and Oriented x 3 Psych: euthymic mood, full affect  Wt Readings from Last 3 Encounters:  02/11/18 251 lb (113.9 kg)  07/14/17 250 lb 1.9 oz (113.5 kg)  12/22/16 247 lb 6.4 oz (112.2 kg)      Studies/Labs Reviewed:    EKG:  EKG is  ordered today.  The ekg ordered today demonstrates probably atrial flutter  Recent Labs: 07/14/2017: BUN 19; Creatinine, Ser 1.04; Hemoglobin 14.3; Platelets 193; Potassium 4.4; Sodium 140   Lipid Panel    Component Value Date/Time   CHOL 104 12/22/2016 0906   CHOL 111 10/28/2013 0813   TRIG 115 12/22/2016 0906   TRIG 146 10/28/2013 0813   HDL 37 (L) 12/22/2016 5093  HDL 37 (L) 10/28/2013 0813   CHOLHDL 2.8 12/22/2016 0906   CHOLHDL 2.4 06/23/2016 0845   VLDL 23 06/23/2016 0845   LDLCALC 44 12/22/2016 0906   LDLCALC 45 10/28/2013 0813    Additional studies/ records that were reviewed today include:  Lower extremity vascular study and ABIs with and without exercise 10/2018Final Interpretation: Right: Resting right ankle-brachial index is within normal range. No evidence of significant right lower extremity arterial disease. The right toe-brachial index is normal. Left: Resting left ankle-brachial index is within normal range. No evidence of significant left lower extremity arterial disease. The left toe-brachial index is normal.  PFTs were done and were normal.  2D echo normal LVEF 60 to 65%.    2D echo 10/2018Study Conclusions   - Left ventricle: The cavity size was normal. Wall thickness was   normal. Systolic function was normal. The estimated ejection   fraction was in the range of 60% to 65%. Wall motion was normal;   there were no regional wall motion abnormalities. - Pulmonary arteries: Systolic pressure was mildly increased. PA   peak pressure: 36 mm Hg (S).   VQ scan 10/2018IMPRESSION: No evidence of pulmonary embolus.     Electronically Signed   By: Rolm Baptise M.D.   On: 07/29/2017 17:09          ASSESSMENT:    1. Permanent atrial fibrillation ([place])   2. Benign essential HTN   3. Atherosclerosis of native coronary artery of native heart without angina pectoris   4. Mixed hyperlipidemia   5. OSA (obstructive sleep apnea)       PLAN:  In order of problems listed above:  Permanent atrial fibrillation on Eliquis check baseline labs today.  No trouble with bleeding.  Benign essential hypertension BP well controlled  Nonobstructive CAD on cath in 2016- no angina  Hyperlipidemia- on caduet(atorvastatin) check fasting lipid panel today.  OSA on CPAP but having trouble getting supplies- Gae Bon is working on with him.    Medication Adjustments/Labs and Tests Ordered: Current medicines are reviewed at length with the patient today.  Concerns regarding medicines are outlined above.  Medication changes, Labs and Tests ordered today are listed in the Patient Instructions below. Patient Instructions  Medication Instructions:  Your physician recommends that you continue on your current medications as directed. Please refer to the Current Medication list given to you today.  If you need a refill on your cardiac medications, please contact your pharmacy first.  Labwork: Today for complete metabolic panel, complete blood count and fasting lipid panel   Testing/Procedures: None ordered   Follow-Up: Your physician wants you to follow-up in: 6 months with Dr. Radford Pax. You will receive a reminder letter in the mail two months in advance. If you don't receive a letter, please call our office to schedule the follow-up appointment.  Any Other Special Instructions Will Be Listed Below (If Applicable).   Thank you for choosing [facility]    [phone]  If you need a refill on your cardiac medications before your next appointment, please call your pharmacy.      Sumner Boast, PA-C  02/11/2018 8:56 AM    [facility] [address], [place], [place]  [address] Phone: [phone]; Fax: [phone]

## 2018-02-11 ENCOUNTER — Ambulatory Visit (INDEPENDENT_AMBULATORY_CARE_PROVIDER_SITE_OTHER): Payer: Medicare Other | Admitting: Physician Assistant

## 2018-02-11 ENCOUNTER — Encounter: Payer: Self-pay | Admitting: Physician Assistant

## 2018-02-11 ENCOUNTER — Telehealth: Payer: Self-pay | Admitting: *Deleted

## 2018-02-11 VITALS — BP 112/68 | HR 69 | Ht 71.0 in | Wt 251.0 lb

## 2018-02-11 DIAGNOSIS — I2510 Atherosclerotic heart disease of native coronary artery without angina pectoris: Secondary | ICD-10-CM | POA: Diagnosis not present

## 2018-02-11 DIAGNOSIS — I4821 Permanent atrial fibrillation: Secondary | ICD-10-CM

## 2018-02-11 DIAGNOSIS — E782 Mixed hyperlipidemia: Secondary | ICD-10-CM | POA: Diagnosis not present

## 2018-02-11 DIAGNOSIS — I482 Chronic atrial fibrillation: Secondary | ICD-10-CM

## 2018-02-11 DIAGNOSIS — I10 Essential (primary) hypertension: Secondary | ICD-10-CM

## 2018-02-11 DIAGNOSIS — G4733 Obstructive sleep apnea (adult) (pediatric): Secondary | ICD-10-CM

## 2018-02-11 LAB — CBC
HEMOGLOBIN: 15 g/dL (ref 13.0–17.7)
Hematocrit: 43.1 % (ref 37.5–51.0)
MCH: 29.5 pg (ref 26.6–33.0)
MCHC: 34.8 g/dL (ref 31.5–35.7)
MCV: 85 fL (ref 79–97)
PLATELETS: 200 10*3/uL (ref 150–379)
RBC: 5.09 x10E6/uL (ref 4.14–5.80)
RDW: 14 % (ref 12.3–15.4)
WBC: 7.2 10*3/uL (ref 3.4–10.8)

## 2018-02-11 LAB — COMPREHENSIVE METABOLIC PANEL
A/G RATIO: 2 (ref 1.2–2.2)
ALT: 25 IU/L (ref 0–44)
AST: 21 IU/L (ref 0–40)
Albumin: 4.5 g/dL (ref 3.5–4.8)
Alkaline Phosphatase: 76 IU/L (ref 39–117)
BILIRUBIN TOTAL: 0.5 mg/dL (ref 0.0–1.2)
BUN/Creatinine Ratio: 15 (ref 10–24)
BUN: 19 mg/dL (ref 8–27)
CHLORIDE: 101 mmol/L (ref 96–106)
CO2: 23 mmol/L (ref 20–29)
Calcium: 9.5 mg/dL (ref 8.6–10.2)
Creatinine, Ser: 1.23 mg/dL (ref 0.76–1.27)
GFR calc Af Amer: 66 mL/min/{1.73_m2} (ref >59)
GFR calc non Af Amer: 57 mL/min/{1.73_m2} — ABNORMAL LOW (ref >59)
GLOBULIN, TOTAL: 2.2 g/dL (ref 1.5–4.5)
Glucose: 143 mg/dL — ABNORMAL HIGH (ref 65–99)
POTASSIUM: 4.9 mmol/L (ref 3.5–5.2)
SODIUM: 138 mmol/L (ref 134–144)
Total Protein: 6.7 g/dL (ref 6.0–8.5)

## 2018-02-11 LAB — LIPID PANEL
CHOLESTEROL TOTAL: 103 mg/dL (ref 100–199)
Chol/HDL Ratio: 2.9 ratio (ref 0.0–5.0)
HDL: 35 mg/dL — ABNORMAL LOW (ref >39)
LDL CALC: 43 mg/dL (ref 0–99)
TRIGLYCERIDES: 123 mg/dL (ref 0–149)
VLDL Cholesterol Cal: 25 mg/dL (ref 5–40)

## 2018-02-11 NOTE — Telephone Encounter (Signed)
Patient was in the office today to see Ermalinda Barrios has been having trouble getting his supplies. After speaking with the patient I called his dme [facility])  to see what needed to happen to get him his supplies. I was informed that [facility] needed to speak directly to the patient about a medicare questionaire and then he can get his supplies. Per [facility] patient already has a prescription on file.  Pt is agreeable to treatment.

## 2018-02-11 NOTE — Patient Instructions (Signed)
Medication Instructions:  Your physician recommends that you continue on your current medications as directed. Please refer to the Current Medication list given to you today.  If you need a refill on your cardiac medications, please contact your pharmacy first.  Labwork: Today for complete metabolic panel, complete blood count and fasting lipid panel   Testing/Procedures: None ordered   Follow-Up: Your physician wants you to follow-up in: 6 months with Dr. Radford Pax. You will receive a reminder letter in the mail two months in advance. If you don't receive a letter, please call our office to schedule the follow-up appointment.  Any Other Special Instructions Will Be Listed Below (If Applicable).   Thank you for choosing [facility]    [phone]  If you need a refill on your cardiac medications before your next appointment, please call your pharmacy.

## 2018-02-17 ENCOUNTER — Encounter (INDEPENDENT_AMBULATORY_CARE_PROVIDER_SITE_OTHER): Payer: Medicare Other | Admitting: Ophthalmology

## 2018-02-17 DIAGNOSIS — I10 Essential (primary) hypertension: Secondary | ICD-10-CM | POA: Diagnosis not present

## 2018-02-17 DIAGNOSIS — H43813 Vitreous degeneration, bilateral: Secondary | ICD-10-CM | POA: Diagnosis not present

## 2018-02-17 DIAGNOSIS — H353132 Nonexudative age-related macular degeneration, bilateral, intermediate dry stage: Secondary | ICD-10-CM

## 2018-02-17 DIAGNOSIS — H35033 Hypertensive retinopathy, bilateral: Secondary | ICD-10-CM

## 2018-02-23 ENCOUNTER — Encounter: Payer: Self-pay | Admitting: Cardiology

## 2018-02-25 ENCOUNTER — Telehealth: Payer: Self-pay | Admitting: *Deleted

## 2018-02-25 NOTE — Telephone Encounter (Signed)
Received fax from [employer] stating they have received application for patient assistance with ELIQUIS. They will respond within 2 business days with decision.

## 2018-03-02 NOTE — Telephone Encounter (Signed)
**Note De-Identified [name]** Approval letter received Austin Mcgrath fax from [place] stating that the pt has been approved for pt assistance with Eliquis from 02/25/18 until 10/05/18. Application Case# [id number]

## 2018-04-19 ENCOUNTER — Telehealth: Payer: Self-pay | Admitting: Cardiology

## 2018-04-19 NOTE — Telephone Encounter (Signed)
Pt calling    *STAT* If patient is at the pharmacy, call can be transferred to refill team.   1. Which medications need to be refilled? (please list name of each medication and dose if known)  [name] 10-80mg      2. Which pharmacy/location (including street and city if local pharmacy) is medication to be sent to? Pt stated it [facility]'t go to [facility] because he is on a drug assistance program. We order meds from the company and mails it to [place] and he comes and pick it up.   3. Do they need a 30 day or 90 day supply? 90day

## 2018-04-19 NOTE — Telephone Encounter (Signed)
**Note De-Identified Austin Mcgrath Obfuscation** I called [employer] [phone]) and requested the pts next shipment/refill on [name]. I was advised by Austin Mcgrath that the pts pt asst with them ran out on 02/02/18 and that he needs to re-apply. I asked Austin Mcgrath to mail the pt a new application to his address and she stated that she will.  I have advised the pts wife Austin Mcgrath who verbalized understanding and states that they will complete the application, provide proof of income and return to me so I can take care of the provider part of the application and fax back to [employer].

## 2018-04-26 ENCOUNTER — Other Ambulatory Visit: Payer: Self-pay

## 2018-04-26 ENCOUNTER — Telehealth: Payer: Self-pay

## 2018-04-26 MED ORDER — AMLODIPINE-ATORVASTATIN 10-80 MG PO TABS: 1.0000 | ORAL_TABLET | Freq: Every day | ORAL | 3 refills | Status: DC

## 2018-04-26 NOTE — Telephone Encounter (Signed)
**Note De-Identified Deanne Bedgood Obfuscation** The pt returned his [place] pt asst application for [name] to the office this morning. I have completed the provider part of the application and have faxed all to [employer].

## 2018-05-05 NOTE — Telephone Encounter (Signed)
The pt is advised that [name] has arrived in the office and that he can pick up at his convenience. He states that he plans to pick up his Tikosyn tomorrow.

## 2018-05-05 NOTE — Telephone Encounter (Signed)
Letter received from [employer] stating that the pt has been approved for pt asst with [name]. Approval is good until 04/27/19. ID# [id number]

## 2018-06-21 ENCOUNTER — Encounter (INDEPENDENT_AMBULATORY_CARE_PROVIDER_SITE_OTHER): Payer: Medicare Other | Admitting: Ophthalmology

## 2018-06-21 DIAGNOSIS — I10 Essential (primary) hypertension: Secondary | ICD-10-CM

## 2018-06-21 DIAGNOSIS — H353132 Nonexudative age-related macular degeneration, bilateral, intermediate dry stage: Secondary | ICD-10-CM | POA: Diagnosis not present

## 2018-06-21 DIAGNOSIS — H43813 Vitreous degeneration, bilateral: Secondary | ICD-10-CM | POA: Diagnosis not present

## 2018-06-21 DIAGNOSIS — H35033 Hypertensive retinopathy, bilateral: Secondary | ICD-10-CM

## 2018-08-03 ENCOUNTER — Telehealth: Payer: Self-pay

## 2018-08-03 NOTE — Telephone Encounter (Signed)
Per the pts request I called [employer] [phone]) and ordered his next shipment of [name] using their automated phone system. [name] should arrive at the office within 7-10 business days. Order ID: [id number] NI:[id number]  Next refill will be 10/10/2018.

## 2018-08-11 NOTE — Telephone Encounter (Signed)
Patient is advised that his caduet has arrived at the office and that I will place it at the front desk for him to pick up at his convenience. He verbalized his understanding and appreciation for my call.

## 2018-09-07 ENCOUNTER — Telehealth: Payer: Self-pay

## 2018-09-07 NOTE — Telephone Encounter (Signed)
**Note De-Identified Marvel Sapp Obfuscation** The pt brought in his 8115 [employer] pt asst application to the office this morning. I have completed the provider part and placed it in Dr Landis Gandy mail bin awaiting her signature.

## 2018-09-08 NOTE — Telephone Encounter (Signed)
**Note De-Identified Assata Juncaj Obfuscation** Dr Radford Pax has signed the [employer] pt asst application for the pts Eliquis and I have faxed it to [employer].

## 2018-10-13 NOTE — Telephone Encounter (Signed)
Letter received from [employer] stating that they have approved the pt for pt asst with Eliquis. Approval good from: 10/11/2018-10/06/2019

## 2018-10-25 ENCOUNTER — Encounter (INDEPENDENT_AMBULATORY_CARE_PROVIDER_SITE_OTHER): Payer: Medicare Other | Admitting: Ophthalmology

## 2018-10-25 DIAGNOSIS — I10 Essential (primary) hypertension: Secondary | ICD-10-CM | POA: Diagnosis not present

## 2018-10-25 DIAGNOSIS — H35033 Hypertensive retinopathy, bilateral: Secondary | ICD-10-CM

## 2018-10-25 DIAGNOSIS — H43813 Vitreous degeneration, bilateral: Secondary | ICD-10-CM | POA: Diagnosis not present

## 2018-10-25 DIAGNOSIS — H2513 Age-related nuclear cataract, bilateral: Secondary | ICD-10-CM

## 2018-10-25 DIAGNOSIS — H353132 Nonexudative age-related macular degeneration, bilateral, intermediate dry stage: Secondary | ICD-10-CM

## 2018-10-26 ENCOUNTER — Telehealth: Payer: Self-pay

## 2018-10-26 NOTE — Telephone Encounter (Signed)
Per the pts request I have called [employer] [phone]) and ordered his next [name] shipment. Per the automatic system the pts [name] will arrive in the office within 7 to 10 business days. Order ID: [id number] ID: [id number]  His next reorder date will be 01/02/19.

## 2018-11-01 NOTE — Telephone Encounter (Signed)
**Note De-Identified Marilynne Dupuis Obfuscation** The pt is advised that [name] arrived in the office today and that I have left it in the front office for him to pick up. He states that he will come by the office tomorrow to pick up.

## 2018-11-16 ENCOUNTER — Other Ambulatory Visit: Payer: Self-pay | Admitting: Otolaryngology

## 2018-11-16 DIAGNOSIS — H903 Sensorineural hearing loss, bilateral: Secondary | ICD-10-CM

## 2018-11-16 DIAGNOSIS — H905 Unspecified sensorineural hearing loss: Secondary | ICD-10-CM

## 2018-11-26 ENCOUNTER — Ambulatory Visit
Admission: RE | Admit: 2018-11-26 | Discharge: 2018-11-26 | Disposition: A | Discharge status: Home / Self Care | Payer: Medicare Other | Source: Ambulatory Visit | Attending: Otolaryngology | Admitting: Otolaryngology

## 2018-11-26 DIAGNOSIS — H905 Unspecified sensorineural hearing loss: Secondary | ICD-10-CM

## 2018-11-26 DIAGNOSIS — H903 Sensorineural hearing loss, bilateral: Secondary | ICD-10-CM

## 2018-11-26 MED ORDER — GADOBENATE DIMEGLUMINE 529 MG/ML IV SOLN
20.0000 mL | Freq: Once | INTRAVENOUS | Status: AC | PRN
  Administered 2018-11-26: 20 mL via INTRAVENOUS

## 2018-12-01 ENCOUNTER — Encounter: Payer: Self-pay | Admitting: Cardiology

## 2018-12-01 ENCOUNTER — Telehealth: Payer: Self-pay | Admitting: *Deleted

## 2018-12-01 ENCOUNTER — Ambulatory Visit (INDEPENDENT_AMBULATORY_CARE_PROVIDER_SITE_OTHER): Payer: Medicare Other | Admitting: Cardiology

## 2018-12-01 VITALS — BP 118/62 | HR 60 | Ht 71.0 in | Wt 249.0 lb

## 2018-12-01 DIAGNOSIS — I2510 Atherosclerotic heart disease of native coronary artery without angina pectoris: Secondary | ICD-10-CM

## 2018-12-01 DIAGNOSIS — E7800 Pure hypercholesterolemia, unspecified: Secondary | ICD-10-CM | POA: Diagnosis not present

## 2018-12-01 DIAGNOSIS — I4821 Permanent atrial fibrillation: Secondary | ICD-10-CM | POA: Diagnosis not present

## 2018-12-01 DIAGNOSIS — I10 Essential (primary) hypertension: Secondary | ICD-10-CM | POA: Diagnosis not present

## 2018-12-01 DIAGNOSIS — G4733 Obstructive sleep apnea (adult) (pediatric): Secondary | ICD-10-CM

## 2018-12-01 LAB — CBC
Hematocrit: 39.3 % (ref 37.5–51.0)
Hemoglobin: 13.8 g/dL (ref 13.0–17.7)
MCH: 29.4 pg (ref 26.6–33.0)
MCHC: 35.1 g/dL (ref 31.5–35.7)
MCV: 84 fL (ref 79–97)
PLATELETS: 195 10*3/uL (ref 150–450)
RBC: 4.7 x10E6/uL (ref 4.14–5.80)
RDW: 13.1 % (ref 11.6–15.4)
WBC: 6.6 10*3/uL (ref 3.4–10.8)

## 2018-12-01 NOTE — Patient Instructions (Signed)
Medication Instructions:  Your physician recommends that you continue on your current medications as directed. Please refer to the Current Medication list given to you today.  If you need a refill on your cardiac medications before your next appointment, please call your pharmacy.   Lab work: Today: CBC  If you have labs (blood work) drawn today and your tests are completely normal, you will receive your results only by: Marland Kitchen MyChart Message (if you have MyChart) OR . A paper copy in the mail If you have any lab test that is abnormal or we need to change your treatment, we will call you to review the results.  Testing/Procedures: None  Follow-Up: At [facility], you and your health needs are our priority.  As part of our continuing mission to provide you with exceptional heart care, we have created designated Provider Care Teams.  These Care Teams include your primary Cardiologist (physician) and Advanced Practice Providers (APPs -  Physician Assistants and Nurse Practitioners) who all work together to provide you with the care you need, when you need it. You will need a follow up appointment in 6 months.  Please call our office 2 months in advance to schedule this appointment.  You may see Fransico Him, MD or one of the following Advanced Practice Providers on your designated Care Team:   [place], PA-C Melina Copa, PA-C . Ermalinda Barrios, PA-C

## 2018-12-01 NOTE — Telephone Encounter (Signed)
-----   Message from Sueanne Margarita, MD sent at 12/01/2018 12:48 PM EST ----- Good AHI and compliance.  Continue current PAP settings.

## 2018-12-01 NOTE — Progress Notes (Signed)
Cardiology Office Note:    Date:  12/01/2018   ID:  Austin Mcgrath, DOB [birth year], MRN [id number]  PCP:  Lujean Amel, MD  Cardiologist:  Fransico Him, MD    Referring MD: Lujean Amel, MD   Chief Complaint  Patient presents with  . Coronary Artery Disease  . Hypertension  . Atrial Fibrillation  . Sleep Apnea    History of Present Illness:    Austin Mcgrath is a 78 y.o. 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  He is here today for followup and is doing well.  He denies any chest pain or pressure, SOB, DOE, PND, orthopnea, LE edema, dizziness, palpitations or syncope. He is compliant with his meds and is tolerating meds with no SE.  He is doing well with his PAP device and thinks that he has gotten used to it.  He tolerates the mask and feels the pressure is adequate.  Since going on PAP he feels rested in the am and has no significant daytime sleepiness.  He denies any significant mouth or nasal dryness or nasal congestion.  He does not think that he snores.     Past Medical History:  Diagnosis Date  . Aortic valve sclerosis    No etenosis Echo 2012 Mild TR  . Arteriosclerotic coronary artery disease    nonobstructive/Mild AI -DR Radford Pax  . Atrial flutter, paroxysmal ([place]) 05/29/2015   CHADS2VASC score is 4 on chronic anticoaguation with DOAC  . BPH (benign prostatic hypertrophy)   . Degenerative disc disease    Dr Cyndy Freeze  . Diabetes mellitus without complication ([place])    Type II w/ microalbuminuria-Dr. Burnett Harry  . Dyslipidemia    elevated cholesterol-Dr Radford Pax  . Gout    Dr Justine Null  . 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 Hypertension   . Macular degeneration    mild hypertensive retinopaty,macular degeneration,referred to retina   . OA (osteoarthritis)    Dr Justine Null  . OSA (obstructive sleep apnea)    severe with AHI 43/hr now on BiPAP at 13/9cm H2O  . Screening for AAA (aortic abdominal aneurysm)  02/2012   No aneurysm  . TR (tricuspid regurgitation)    Mild  . Tubular adenoma of colon    colonoscopy 05/2011 repeat in 5 years -Dr. Watt Climes    Past Surgical History:  Procedure Laterality Date  . APPENDECTOMY    . BUNIONECTOMY    . CARDIAC CATHETERIZATION N/A 04/27/2015   Procedure: Left Heart Cath and Coronary Angiography;  Surgeon: Jettie Booze, MD;  Location: [facility];  Service: Cardiovascular;  Laterality: N/A;  . foot sx  2006  . HERNIA REPAIR  Age 33  . L-Disk L5-S1 (right)  1993    Current Medications: Current Meds  Medication Sig  . acetaminophen (TYLENOL) 325 MG tablet Take 650 mg by mouth every 6 (six) hours as needed.  Marland Kitchen allopurinol (ZYLOPRIM) 300 MG tablet Take 300 mg by mouth daily.  Marland Kitchen amLODipine-atorvastatin ([name]) 10-80 MG tablet Take 1 tablet by mouth daily.  Marland Kitchen apixaban (ELIQUIS) 5 MG TABS tablet Take 1 tablet (5 mg total) by mouth 2 (two) times daily.  . benazepril (LOTENSIN) 20 MG tablet Take 20 mg by mouth daily.  Marland Kitchen glimepiride (AMARYL) 1 MG tablet TAKE 1 TABLET BY MOUTH WITH BREAKFAST OR THE FIRST MAIN MEAL OF THE DAY ONCE A DAY  . Multiple Vitamins-Minerals (ICAPS PO) Take 1 tablet by  mouth daily.  . Omega 3 1200 MG CAPS Take 4 capsules by mouth daily.  Marland Kitchen spironolactone (ALDACTONE) 25 MG tablet Take 1 tablet (25 mg total) by mouth daily.     Allergies:   Contrast media [iodinated diagnostic agents]   Social History   Socioeconomic History  . Marital status: Married    Spouse name: Not on file  . Number of children: Not on file  . Years of education: Not on file  . Highest education level: Not on file  Occupational History  . Not on file  Social Needs  . Financial resource strain: Not on file  . Food insecurity:    Worry: Not on file    Inability: Not on file  . Transportation needs:    Medical: Not on file    Non-medical: Not on file  Tobacco Use  . Smoking status: Former Smoker    Last attempt to quit: 10/07/1971    Years  since quitting: 47.1  . Smokeless tobacco: Never Used  Substance and Sexual Activity  . Alcohol use: No  . Drug use: No  . Sexual activity: Not on file  Lifestyle  . Physical activity:    Days per week: Not on file    Minutes per session: Not on file  . Stress: Not on file  Relationships  . Social connections:    Talks on phone: Not on file    Gets together: Not on file    Attends religious service: Not on file    Active member of club or organization: Not on file    Attends meetings of clubs or organizations: Not on file    Relationship status: Not on file  Other Topics Concern  . Not on file  Social History Narrative  . Not on file     Family History: The patient's family history includes Breast cancer in his sister; Colon cancer in his sister; Hypertension in his brother and sister; Lung cancer in his mother. There is no history of Heart attack or Stroke.  ROS:   Please see the history of present illness.    ROS  All other systems reviewed and negative.   EKGs/Labs/Other Studies Reviewed:    The following studies were reviewed today: none  EKG:  EKG is not ordered today.    Recent Labs: 02/11/2018: ALT 25; BUN 19; Creatinine, Ser 1.23; Hemoglobin 15.0; Platelets 200; Potassium 4.9; Sodium 138   Recent Lipid Panel    Component Value Date/Time   CHOL 103 02/11/2018 0908   CHOL 111 10/28/2013 0813   TRIG 123 02/11/2018 0908   TRIG 146 10/28/2013 0813   HDL 35 (L) 02/11/2018 0908   HDL 37 (L) 10/28/2013 0813   CHOLHDL 2.9 02/11/2018 0908   CHOLHDL 2.4 06/23/2016 0845   VLDL 23 06/23/2016 0845   LDLCALC 43 02/11/2018 0908   LDLCALC 45 10/28/2013 0813    Physical Exam:    VS:  BP 118/62   Pulse 60   Ht 5\' 11"  (1.803 m)   Wt 249 lb (112.9 kg)   BMI 34.73 kg/m     Wt Readings from Last 3 Encounters:  12/01/18 249 lb (112.9 kg)  02/11/18 251 lb (113.9 kg)  07/14/17 250 lb 1.9 oz (113.5 kg)     GEN:  Well nourished, well developed in no acute  distress HEENT: Normal NECK: No JVD; No carotid bruits LYMPHATICS: No lymphadenopathy CARDIAC: RRR, no murmurs, rubs, gallops RESPIRATORY:  Clear to auscultation without rales, wheezing or  rhonchi  ABDOMEN: Soft, non-tender, non-distended MUSCULOSKELETAL:  No edema; No deformity  SKIN: Warm and dry NEUROLOGIC:  Alert and oriented x 3 PSYCHIATRIC:  Normal affect   ASSESSMENT:    1. Atherosclerosis of native coronary artery of native heart without angina pectoris   2. Benign essential HTN   3. Permanent atrial fibrillation (HCC)   4. Pure hypercholesterolemia   5. OSA (obstructive sleep apnea)    PLAN:    In order of problems listed above:  1.  ASCAD -nonobstructive by cath.  He denies any anginal symptoms.  He is not on aspirin due to apixaban.  2.  HTN -he is controlled on exam today.  He will continue on [name] 10-80 mg daily, benazepril 20 mg daily and spironolactone 25 mg daily.  His creatinine was normal at 1.18 and potassium 4.7 on 11/05/2018.  3.  Permanent atrial fibrillation -rate is well controlled today on exam.  He will continue on Eliquis 5 mg twice daily.  His hemoglobin was stable in May so I will recheck another one today since it has been almost a year.  He denies any bleeding problems.  4.  Hyperlipidemia -LDL goal is less than 70.  His LDL was 46 on 11/05/2018 and ALT normal at 18.  He will continue on [name] 10-80 mg daily.  5.  OSA - the patient is tolerating PAP therapy well without any problems. The PAP download was reviewed today and showed an AHI of 2.1/hr on 13/9 cm H2O with 93% compliance in using more than 4 hours nightly.  The patient has been using and benefiting from PAP use and will continue to benefit from therapy.      Medication Adjustments/Labs and Tests Ordered: Current medicines are reviewed at length with the patient today.  Concerns regarding medicines are outlined above.  No orders of the defined types were placed in this encounter.  No  orders of the defined types were placed in this encounter.   Signed, Fransico Him, MD  12/01/2018 8:48 AM    [place]

## 2018-12-01 NOTE — Telephone Encounter (Signed)
Informed patient of compliance results and verbalized understanding was indicated. Patient is aware and agreeable to AHI being within range at 2.1. Patient is aware and agreeable to being in compliance with machine usage Patient is aware and agreeable to no change in current pressures. 

## 2019-01-20 ENCOUNTER — Telehealth: Payer: Self-pay | Admitting: Cardiology

## 2019-01-20 NOTE — Telephone Encounter (Signed)
Attempted, no voicemail box. He is on 10 mg Amlodipine and 80 mg atorvastatin.

## 2019-01-20 NOTE — Telephone Encounter (Signed)
New Message:   Patient calling and would like for some one to call him concerning his medication name caduet patient not sure of mg.

## 2019-01-21 ENCOUNTER — Telehealth: Payer: Self-pay

## 2019-01-21 NOTE — Telephone Encounter (Signed)
**Note De-Identified Ly Wass Obfuscation** Per a [place] message from the pts daughter I have called [employer] at [phone]) and ordered his next [name] shipment. I s/w Ysidro Evert at [employer] and he states that the pts [name] will arrive in the office within 7 to 10 business days. Order ID: [id number] ID: [id number]  I have replied to the pts daughter through the [facility] message that the pts application will have to be re-enroll by 04/27/2019

## 2019-01-24 ENCOUNTER — Encounter (INDEPENDENT_AMBULATORY_CARE_PROVIDER_SITE_OTHER): Payer: Medicare Other | Admitting: Ophthalmology

## 2019-01-25 NOTE — Telephone Encounter (Signed)
Spoke with the patient's wife, she expressed understanding about the medication contents.

## 2019-01-28 ENCOUNTER — Telehealth: Payer: Self-pay | Admitting: Cardiology

## 2019-01-28 NOTE — Telephone Encounter (Signed)
Called pt and spoke with pt's wife informing her that the pt's medication of amlodipine-atorvastatin 3 bottles of 30 tablets a piece, leaving at the front desk for pt to pick up. Pt's wife verbalized understanding.

## 2019-03-11 ENCOUNTER — Telehealth: Payer: Self-pay

## 2019-03-11 NOTE — Telephone Encounter (Signed)
**Note De-Identified Izetta Sakamoto Obfuscation** The pt dropped off his [employer] pt asst application for [name] at the office. MD part of the application completed, Dr Radford Pax signed it, and it was faxed to [place].

## 2019-03-14 ENCOUNTER — Encounter (INDEPENDENT_AMBULATORY_CARE_PROVIDER_SITE_OTHER): Payer: Medicare Other | Admitting: Ophthalmology

## 2019-03-14 ENCOUNTER — Other Ambulatory Visit: Payer: Self-pay

## 2019-03-14 DIAGNOSIS — H353122 Nonexudative age-related macular degeneration, left eye, intermediate dry stage: Secondary | ICD-10-CM | POA: Diagnosis not present

## 2019-03-14 DIAGNOSIS — H353211 Exudative age-related macular degeneration, right eye, with active choroidal neovascularization: Secondary | ICD-10-CM | POA: Diagnosis not present

## 2019-03-14 DIAGNOSIS — H35033 Hypertensive retinopathy, bilateral: Secondary | ICD-10-CM

## 2019-03-14 DIAGNOSIS — I10 Essential (primary) hypertension: Secondary | ICD-10-CM

## 2019-03-14 DIAGNOSIS — H43813 Vitreous degeneration, bilateral: Secondary | ICD-10-CM

## 2019-03-18 IMAGING — MR MR HEAD WO/W CM
11 of 12 series · 43 of 48 positions shown · IV contrast (multihance)
Comparison: 07/24/2004 MRI head.

CLINICAL DATA: 78 y/o M; hearing loss with pain and ringing in the
right ear.

EXAM:
MRI HEAD WITHOUT AND WITH CONTRAST
TECHNIQUE: Multiplanar, multiecho pulse sequences of the brain and surrounding
structures were obtained without and with intravenous contrast.
Internal auditory canal protocol.
CONTRAST:  20mL MULTIHANCE GADOBENATE DIMEGLUMINE 529 MG/ML IV SOLN

[Series 5: T1 · sagittal · 4.0mm · 0.72mm/px · 3 of 30 slices shown (1 of 3)]
[im 1/30]
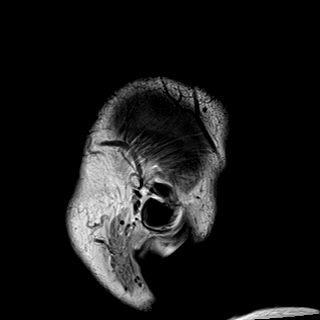
[im 15/30]
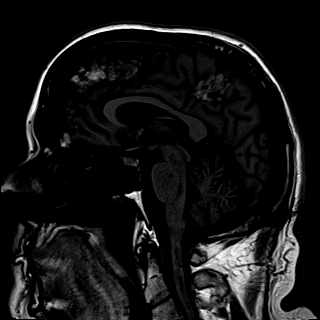
[im 30/30]
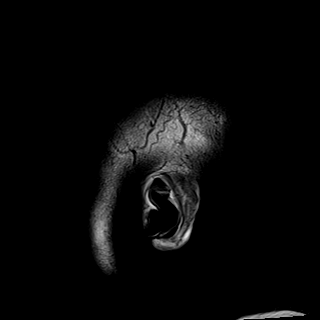

[Series 6: DWI · axial · 3.0mm · 1.44mm/px · z∈[-26,+110]mm · 7 of 88 slices shown (1 of 2)]
[im 1/88]
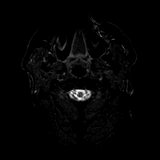
[im 15/88]
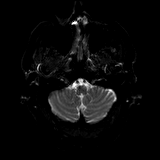
[im 30/88]
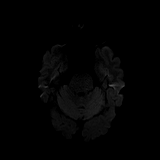
[im 44/88]
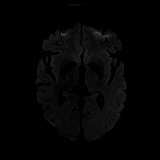
[im 59/88]
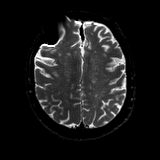
[im 73/88]
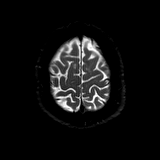
[im 88/88]
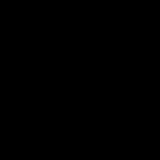

[Series 7: DWI · axial · 3.0mm · 1.44mm/px · z∈[-26,+110]mm · 4 of 44 slices shown (2 of 2)]
[im 1/44]
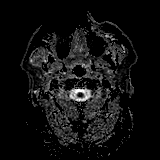
[im 15/44]
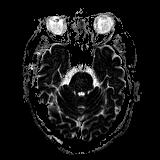
[im 29/44]
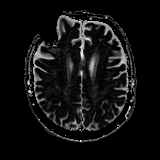
[im 44/44]
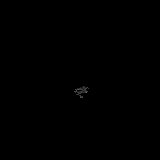

[Series 8: T2 · axial · 4.0mm · 0.36mm/px · z∈[-19,+103]mm · 2 of 30 slices shown]
[im 1/30]
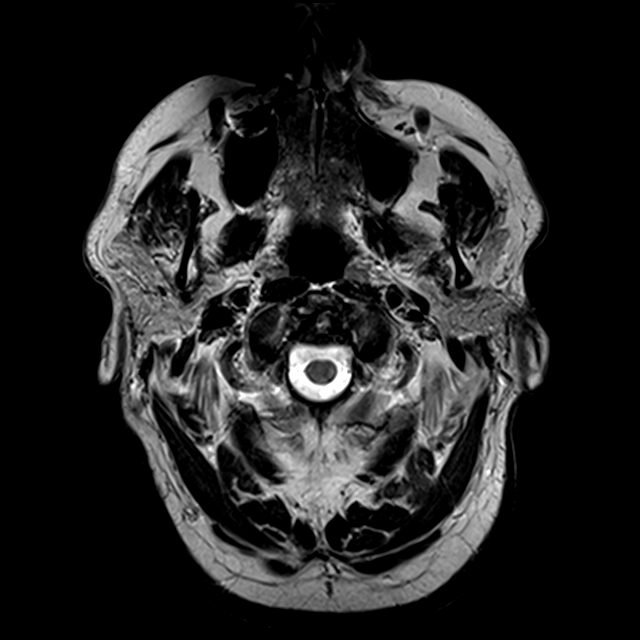
[im 30/30]
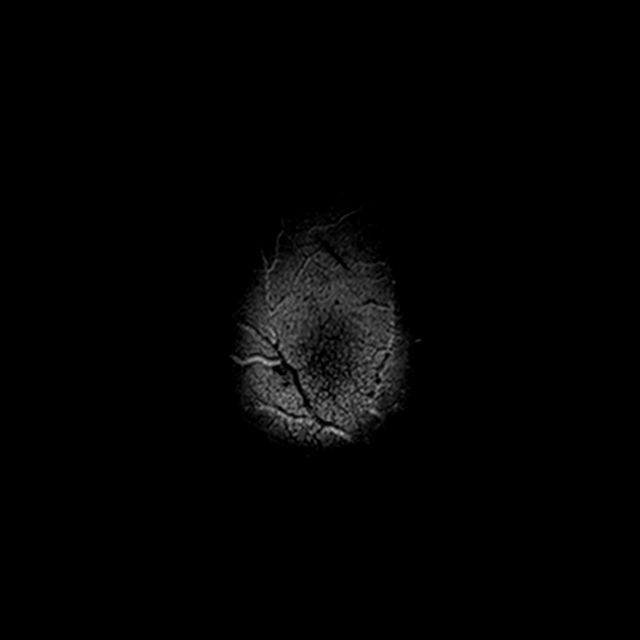

[Series 9: FLAIR · axial · 3.0mm · 0.72mm/px · z∈[-24,+108]mm · 2 of 28 slices shown]
[im 1/28]
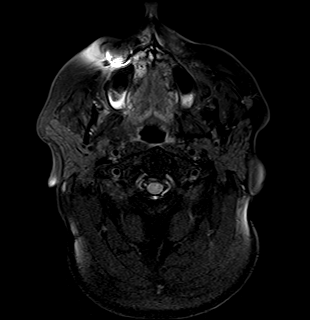
[im 28/28]
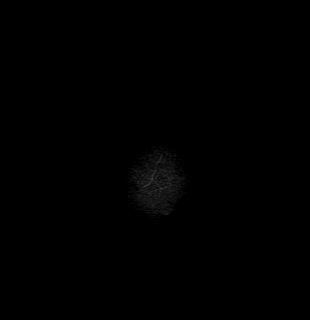

[Series 11: swi_images · axial · 1.5mm · 0.90mm/px · z∈[-16,+100]mm · 8 of 96 slices shown]
[im 1/96]
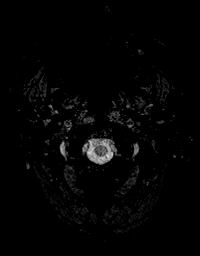
[im 14/96]
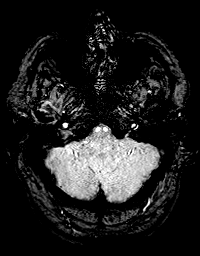
[im 28/96]
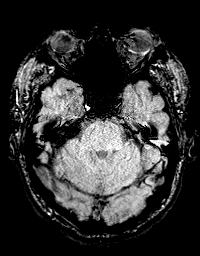
[im 41/96]
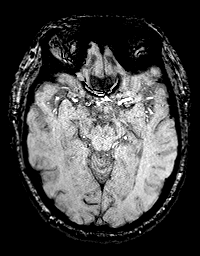
[im 55/96]
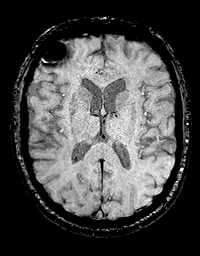
[im 68/96]
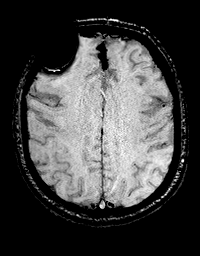
[im 82/96]
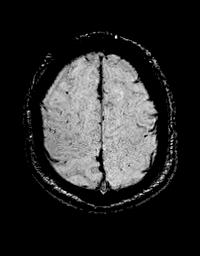
[im 96/96]
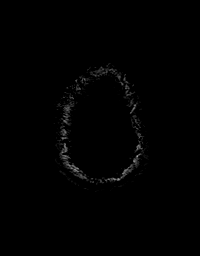

[Series 12: T1 · coronal · 2.5mm · 0.56mm/px · 1 of 13 slices shown (2 of 3)]
[im 1/13]
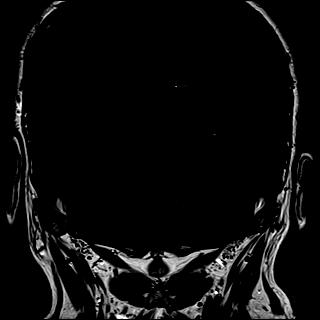

[Series 13: T1 · axial · 2.5mm · 0.50mm/px · 1 of 13 slices shown (3 of 3)]
[im 1/13]
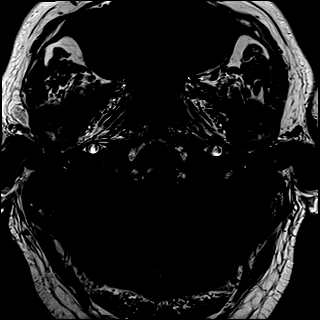

[Series 15: T1 post-contrast · coronal · 2.5mm · 0.56mm/px · 1 of 13 slices shown (1 of 3)]
[im 1/13]
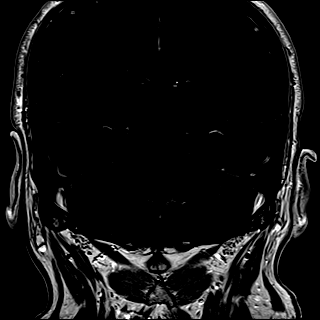

[Series 16: T1 post-contrast · axial · 2.5mm · 0.50mm/px · 1 of 13 slices shown (2 of 3)]
[im 1/13]
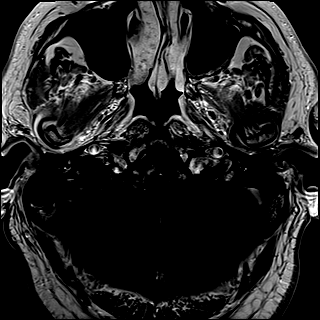

[Series 17: T1 post-contrast · axial · 1.0mm · 0.90mm/px · z∈[-22,+108]mm · 13 of 160 slices shown (3 of 3)]
[im 1/160]
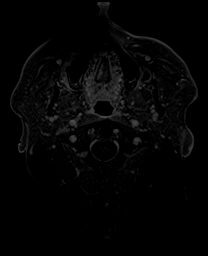
[im 14/160]
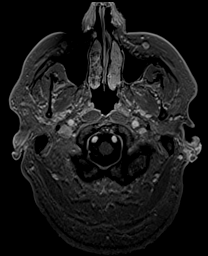
[im 27/160]
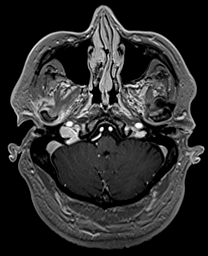
[im 40/160]
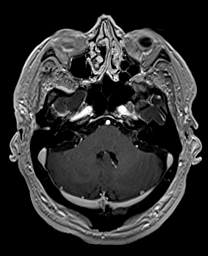
[im 54/160]
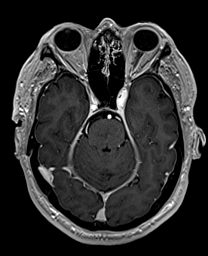
[im 67/160]
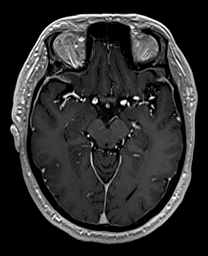
[im 80/160]
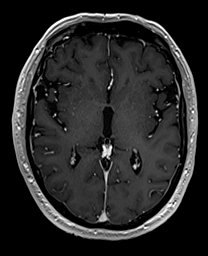
[im 93/160]
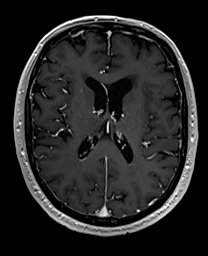
[im 107/160]
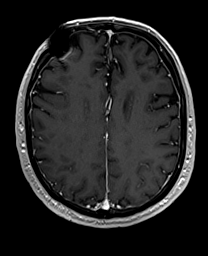
[im 120/160]
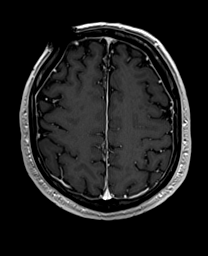
[im 133/160]
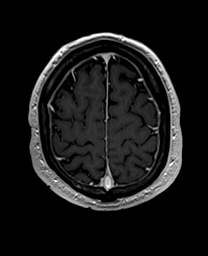
[im 146/160]
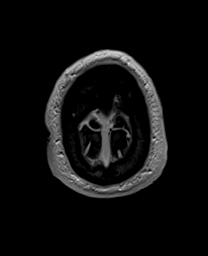
[im 160/160]
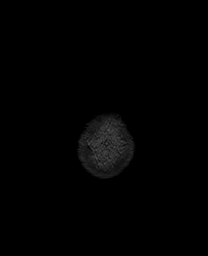

[43 of 48 positions shown; findings below may reference images not displayed]

FINDINGS: Internal auditory canal: No mass or abnormal enhancement. Cranial
nerve 7 and 8 complexes are intact. Inner ear structures are
morphologically normal and demonstrate normal signal. No high-riding
jugular bulb. No neurovascular compression. Bilateral AICA loop in
the outer third of the internal auditory canals. 3

Incidental note of a stable 9 mm cystic structure with internal
septation centered within the clivus and extending into the
prepontine cistern and no enhancement after administration of
intravenous contrast compatible with ecchordosis physaliphora. No
significant mass effect on prepontine structures.

Brain: No acute infarction, hemorrhage, hydrocephalus, extra-axial
collection or mass lesion. Early confluent nonspecific T2 FLAIR
hyperintensities in subcortical and periventricular white matter as
well as the pons are compatible with moderate chronic microvascular
ischemic changes. Mild volume loss of the brain. Punctate focus of
susceptibility hypointensity within the right occipital lobe
probably represents hemosiderin deposition from chronic
microhemorrhage.

Vascular: Normal flow voids.

Skull and upper cervical spine: Susceptibility artifact in the right
frontal scalp, possibly related to prior surgery or metallic foreign
body.

Sinuses/Orbits: Negative.

Other: None.
IMPRESSION: 1. No mass or abnormal enhancement of the internal auditory canals.
Inner ear structures are morphologically normal with normal signal.
2. Stable ecchordosis physaliphora.
3. Moderate chronic microvascular ischemic changes and mild volume
loss of the brain.

## 2019-04-11 ENCOUNTER — Telehealth: Payer: Self-pay | Admitting: Cardiology

## 2019-04-11 NOTE — Telephone Encounter (Signed)
Mr Ende needs his amLODipine-atorvastatin ([name]) 10-80 MG tablet filled.

## 2019-04-12 NOTE — Telephone Encounter (Signed)
The pt is requesting that I call [facility] and request his next shipment of [name].  I called [employer] [phone]) and s/w Njeri who advised me that the pt has been approved for [employer] pt asst program for [name] until July 2021. Per Njeri we are requesting this refill to soon and to call back to order on 7/23 when the pts new application account starts. ID: [id number]  I have called the pt and made him aware. He states that he will call me back to request his next [name] shipment closer to 7/23.  He thanked me for my assistance this morning.

## 2019-04-15 ENCOUNTER — Other Ambulatory Visit: Payer: Self-pay

## 2019-04-15 ENCOUNTER — Encounter (INDEPENDENT_AMBULATORY_CARE_PROVIDER_SITE_OTHER): Payer: Medicare Other | Admitting: Ophthalmology

## 2019-04-15 DIAGNOSIS — H43813 Vitreous degeneration, bilateral: Secondary | ICD-10-CM

## 2019-04-15 DIAGNOSIS — I10 Essential (primary) hypertension: Secondary | ICD-10-CM

## 2019-04-15 DIAGNOSIS — H35033 Hypertensive retinopathy, bilateral: Secondary | ICD-10-CM | POA: Diagnosis not present

## 2019-04-15 DIAGNOSIS — H353231 Exudative age-related macular degeneration, bilateral, with active choroidal neovascularization: Secondary | ICD-10-CM

## 2019-04-15 DIAGNOSIS — H2513 Age-related nuclear cataract, bilateral: Secondary | ICD-10-CM

## 2019-04-27 ENCOUNTER — Telehealth: Payer: Self-pay

## 2019-04-27 NOTE — Telephone Encounter (Signed)
**Note De-Identified Austin Mcgrath Obfuscation** Per the pts request I called [employer] at [phone] to order his next 90 day shipment of [name]. ID: [id number] Order ID: [id number]  I s/w Coral at [place] who advised me that the pts [name] re-enrollment starts tomorrow and is active until April 27, 2020.  She states that they will ship the pts [name] out tomorrow when his re-enrollment begins and that we should receive it at the office within 7 to 10 business days.  Per Coral the pts next re-order will be around 07/04/2019.

## 2019-05-10 NOTE — Telephone Encounter (Signed)
Called pt to inform him that we received his medication [name] from [employer] patient assistant and that I was leaving downstairs for pt to pick up and if he has any other problems, questions or concerns to call the office. Pt verbalize understanding.

## 2019-05-13 ENCOUNTER — Other Ambulatory Visit: Payer: Self-pay

## 2019-05-13 ENCOUNTER — Encounter (INDEPENDENT_AMBULATORY_CARE_PROVIDER_SITE_OTHER): Payer: Medicare Other | Admitting: Ophthalmology

## 2019-05-13 DIAGNOSIS — I10 Essential (primary) hypertension: Secondary | ICD-10-CM | POA: Diagnosis not present

## 2019-05-13 DIAGNOSIS — H35033 Hypertensive retinopathy, bilateral: Secondary | ICD-10-CM | POA: Diagnosis not present

## 2019-05-13 DIAGNOSIS — H43813 Vitreous degeneration, bilateral: Secondary | ICD-10-CM | POA: Diagnosis not present

## 2019-05-13 DIAGNOSIS — H353231 Exudative age-related macular degeneration, bilateral, with active choroidal neovascularization: Secondary | ICD-10-CM

## 2019-06-09 ENCOUNTER — Encounter (INDEPENDENT_AMBULATORY_CARE_PROVIDER_SITE_OTHER): Payer: Medicare Other | Admitting: Ophthalmology

## 2019-06-09 ENCOUNTER — Other Ambulatory Visit: Payer: Self-pay

## 2019-06-09 DIAGNOSIS — H35033 Hypertensive retinopathy, bilateral: Secondary | ICD-10-CM

## 2019-06-09 DIAGNOSIS — H2513 Age-related nuclear cataract, bilateral: Secondary | ICD-10-CM

## 2019-06-09 DIAGNOSIS — H43813 Vitreous degeneration, bilateral: Secondary | ICD-10-CM | POA: Diagnosis not present

## 2019-06-09 DIAGNOSIS — H353231 Exudative age-related macular degeneration, bilateral, with active choroidal neovascularization: Secondary | ICD-10-CM

## 2019-06-09 DIAGNOSIS — I10 Essential (primary) hypertension: Secondary | ICD-10-CM | POA: Diagnosis not present

## 2019-06-23 ENCOUNTER — Telehealth: Payer: Self-pay | Admitting: *Deleted

## 2019-06-23 NOTE — Telephone Encounter (Signed)
Called the patient at the request of Gershon Cull CMA. She had spoken with the patient to make his appointment virtual at the request of Dr. Radford Pax.  When I called his wife answered and said under no circumstances was he going to do a virtual visit.  She said her husband said all his other doctors see him in person and  all he has to do to see them is wear a mask and have his temperature checked.  He says he will cancel the appointment and reschedule to a time when Dr. Radford Pax can see him in person.  I will forward this to Dr Radford Pax as she is in the office 9/21 and see if she would like me to cancel the appointment and or see him in office that day as she is onsite.  He is a sleep patient and Dr Radford Pax is seeing all sleep patients virtually.  I did try to explain this but wife states he is not going to do virtual and she is not going to make him.  I again tried to explain the why behind virtual appointments but this was not received.

## 2019-06-24 NOTE — Telephone Encounter (Signed)
In office visit is fine 

## 2019-06-27 NOTE — Progress Notes (Signed)
Cardiology Office Note:    Date:  06/28/2019   ID:  LEORN FAIST, DOB May 31, 1941, MRN [id number]  PCP:  Lujean Amel, MD  Cardiologist:  Fransico Him, MD    Referring MD: Lujean Amel, MD   Chief Complaint  Patient presents with  . Coronary Artery Disease  . Hypertension  . Sleep Apnea  . Atrial Fibrillation    History of Present Illness:    Austin Mcgrath is a 78 y.o. male with a hx of nonobstructive ASCADby cath 2016, HTN, OSA, paroxysmal atrial flutter on Eliquis and dyslipidemia.  He is here today for followup and is doing well.  He denies any chest pain or pressure, PND, orthopnea, LE edema, dizziness, palpitations or syncope. He has chronic DOE that has not changed any.  He is compliant with his meds and is tolerating meds with no SE.    He is doing well with his CPAP device and thinks that he has gotten used to it.  He tolerates the mask and feels the pressure is adequate.  He does feel sleepy some when he gets up in the am.   He denies any significant mouth or nasal dryness or nasal congestion.  He does not think that he snores.     Past Medical History:  Diagnosis Date  . Aortic valve sclerosis    No etenosis Echo 2012 Mild TR  . Arteriosclerotic coronary artery disease    nonobstructive/Mild AI -DR Radford Pax  . Atrial flutter, paroxysmal ([place]) 05/29/2015   CHADS2VASC score is 4 on chronic anticoaguation with DOAC  . BPH (benign prostatic hypertrophy)   . Degenerative disc disease    Dr Cyndy Freeze  . 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 Burnett Harry  . Dyslipidemia    elevated cholesterol-Dr Radford Pax  . Gout    Dr Justine Null  . 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 Hypertension   . Macular degeneration    mild hypertensive retinopaty,macular degeneration,referred to retina   . OA (osteoarthritis)    Dr Justine Null  . OSA (obstructive sleep apnea)    severe with AHI 43/hr now on BiPAP at 13/9cm H2O  . Screening for AAA (aortic abdominal  aneurysm) 02/2012   No aneurysm  . TR (tricuspid regurgitation)    Mild  . Tubular adenoma of colon    colonoscopy 05/2011 repeat in 5 years -Dr. Watt Climes    Past Surgical History:  Procedure Laterality Date  . APPENDECTOMY    . BUNIONECTOMY    . CARDIAC CATHETERIZATION N/A 04/27/2015   Procedure: Left Heart Cath and Coronary Angiography;  Surgeon: Jettie Booze, MD;  Location: [facility];  Service: Cardiovascular;  Laterality: N/A;  . foot sx  2006  . HERNIA REPAIR  Age 6  . L-Disk L5-S1 (right)  1993    Current Medications: Current Meds  Medication Sig  . acetaminophen (TYLENOL) 325 MG tablet Take 650 mg by mouth every 6 (six) hours as needed.  Marland Kitchen allopurinol (ZYLOPRIM) 300 MG tablet Take 300 mg by mouth daily.  Marland Kitchen amLODipine-atorvastatin ([name]) 10-80 MG tablet Take 1 tablet by mouth daily.  Marland Kitchen apixaban (ELIQUIS) 5 MG TABS tablet Take 1 tablet (5 mg total) by mouth 2 (two) times daily.  . benazepril (LOTENSIN) 20 MG tablet Take 20 mg by mouth daily.  Marland Kitchen glimepiride (AMARYL) 1 MG tablet TAKE 1 TABLET BY MOUTH WITH BREAKFAST OR THE FIRST MAIN MEAL OF THE DAY ONCE A DAY  . Multiple Vitamins-Minerals (  ICAPS PO) Take 1 tablet by mouth daily.  . Omega 3 1200 MG CAPS Take 4 capsules by mouth daily.  Marland Kitchen spironolactone (ALDACTONE) 25 MG tablet Take 1 tablet (25 mg total) by mouth daily.     Allergies:   Contrast media [iodinated diagnostic agents]   Social History   Socioeconomic History  . Marital status: Married    Spouse name: Not on file  . Number of children: Not on file  . Years of education: Not on file  . Highest education level: Not on file  Occupational History  . Not on file  Social Needs  . Financial resource strain: Not on file  . Food insecurity    Worry: Not on file    Inability: Not on file  . Transportation needs    Medical: Not on file    Non-medical: Not on file  Tobacco Use  . Smoking status: Former Smoker    Quit date: 10/07/1971    Years since  quitting: 47.7  . Smokeless tobacco: Never Used  Substance and Sexual Activity  . Alcohol use: No  . Drug use: No  . Sexual activity: Not on file  Lifestyle  . Physical activity    Days per week: Not on file    Minutes per session: Not on file  . Stress: Not on file  Relationships  . Social Herbalist on phone: Not on file    Gets together: Not on file    Attends religious service: Not on file    Active member of club or organization: Not on file    Attends meetings of clubs or organizations: Not on file    Relationship status: Not on file  Other Topics Concern  . Not on file  Social History Narrative  . Not on file     Family History: The patient's family history includes Breast cancer in his sister; Colon cancer in his sister; Hypertension in his brother and sister; Lung cancer in his mother. There is no history of Heart attack or Stroke.  ROS:   Please see the history of present illness.    ROS  All other systems reviewed and negative.   EKGs/Labs/Other Studies Reviewed:    The following studies were reviewed today: none  EKG:  EKG is not ordered today.   Recent Labs: 12/01/2018: Hemoglobin 13.8; Platelets 195   Recent Lipid Panel    Component Value Date/Time   CHOL 103 02/11/2018 0908   CHOL 111 10/28/2013 0813   TRIG 123 02/11/2018 0908   TRIG 146 10/28/2013 0813   HDL 35 (L) 02/11/2018 0908   HDL 37 (L) 10/28/2013 0813   CHOLHDL 2.9 02/11/2018 0908   CHOLHDL 2.4 06/23/2016 0845   VLDL 23 06/23/2016 0845   LDLCALC 43 02/11/2018 0908   LDLCALC 45 10/28/2013 0813    Physical Exam:    VS:  BP 136/64   Pulse (!) 50   Ht 5\' 11"  (1.803 m)   Wt 250 lb (113.4 kg)   BMI 34.87 kg/m     Wt Readings from Last 3 Encounters:  06/28/19 250 lb (113.4 kg)  12/01/18 249 lb (112.9 kg)  02/11/18 251 lb (113.9 kg)     GEN:  Well nourished, well developed in no acute distress HEENT: Normal NECK: No JVD; No carotid bruits LYMPHATICS: No  lymphadenopathy CARDIAC: RRR, no murmurs, rubs, gallops RESPIRATORY:  Clear to auscultation without rales, wheezing or rhonchi  ABDOMEN: Soft, non-tender, non-distended MUSCULOSKELETAL:  No  edema; No deformity  SKIN: Warm and dry NEUROLOGIC:  Alert and oriented x 3 PSYCHIATRIC:  Normal affect   ASSESSMENT:    1. Atherosclerosis of native coronary artery of native heart without angina pectoris   2. Benign essential HTN   3. Permanent atrial fibrillation (HCC)   4. Pure hypercholesterolemia   5. OSA (obstructive sleep apnea)    PLAN:    In order of problems listed above:  1.  ASCAD -nonobstructive by cath. He has not had any anginal CP.  He is not on ASA due to DOAC.  He will continue on statin.    2.  HTN - his BP is controlled.  Continue on Spiro 25mg  daily, benazepril 20mg  daily and [name] 10-80mg  daily.  Creatinine was normal at 1.1 recently and K+ 4.2.  3.  Permanent atrial fibrillation -He is maintaining NSR on exam today.  He will continue on Eliquis 5mg  BID and has not had any problems with excessive bleeding.    4.  Hyperlipidemia -LDL goal is less than 70.  LDL was 46 10/2018. Continue [name] 10-80mg  daily.  5.  OSA -The PAP download was reviewed today and showed an AHI of 2.2/hr on 13/9 cm H2O with 100% compliance in using more than 4 hours nightly.  The patient has been using and benefiting from PAP use and will continue to benefit from therapy.     Medication Adjustments/Labs and Tests Ordered: Current medicines are reviewed at length with the patient today.  Concerns regarding medicines are outlined above.  No orders of the defined types were placed in this encounter.  No orders of the defined types were placed in this encounter.   Signed, Fransico Him, MD  06/28/2019 10:22 AM    [place]

## 2019-06-28 ENCOUNTER — Other Ambulatory Visit: Payer: Self-pay

## 2019-06-28 ENCOUNTER — Ambulatory Visit (INDEPENDENT_AMBULATORY_CARE_PROVIDER_SITE_OTHER): Payer: Medicare Other | Admitting: Cardiology

## 2019-06-28 ENCOUNTER — Encounter: Payer: Self-pay | Admitting: Cardiology

## 2019-06-28 VITALS — BP 136/64 | HR 50 | Ht 71.0 in | Wt 250.0 lb

## 2019-06-28 DIAGNOSIS — I10 Essential (primary) hypertension: Secondary | ICD-10-CM | POA: Diagnosis not present

## 2019-06-28 DIAGNOSIS — E7800 Pure hypercholesterolemia, unspecified: Secondary | ICD-10-CM | POA: Diagnosis not present

## 2019-06-28 DIAGNOSIS — G4733 Obstructive sleep apnea (adult) (pediatric): Secondary | ICD-10-CM

## 2019-06-28 DIAGNOSIS — I2510 Atherosclerotic heart disease of native coronary artery without angina pectoris: Secondary | ICD-10-CM

## 2019-06-28 DIAGNOSIS — I4821 Permanent atrial fibrillation: Secondary | ICD-10-CM | POA: Diagnosis not present

## 2019-06-28 NOTE — Patient Instructions (Signed)
Medication Instructions:  Your physician recommends that you continue on your current medications as directed. Please refer to the Current Medication list given to you today.  If you need a refill on your cardiac medications before your next appointment, please call your pharmacy.   Lab work: None Ordered If you have labs (blood work) drawn today and your tests are completely normal, you will receive your results only by: Marland Kitchen MyChart Message (if you have MyChart) OR . A paper copy in the mail If you have any lab test that is abnormal or we need to change your treatment, we will call you to review the results.  Testing/Procedures: None Ordered  Follow-Up: At [facility], you and your health needs are our priority.  As part of our continuing mission to provide you with exceptional heart care, we have created designated Provider Care Teams.  These Care Teams include your primary Cardiologist (physician) and Advanced Practice Providers (APPs -  Physician Assistants and Nurse Practitioners) who all work together to provide you with the care you need, when you need it. You will need a follow up appointment in 1 years.  Please call our office 2 months in advance to schedule this appointment.  You may see Fransico Him, MD or one of the following Advanced Practice Providers on your designated Care Team:   [place], PA-C Melina Copa, PA-C . Ermalinda Barrios, PA-C  Any Other Special Instructions Will Be Listed Below (If Applicable).

## 2019-07-11 ENCOUNTER — Encounter (INDEPENDENT_AMBULATORY_CARE_PROVIDER_SITE_OTHER): Payer: Medicare Other | Admitting: Ophthalmology

## 2019-07-11 ENCOUNTER — Other Ambulatory Visit: Payer: Self-pay

## 2019-07-11 DIAGNOSIS — I10 Essential (primary) hypertension: Secondary | ICD-10-CM

## 2019-07-11 DIAGNOSIS — H35033 Hypertensive retinopathy, bilateral: Secondary | ICD-10-CM

## 2019-07-11 DIAGNOSIS — H2513 Age-related nuclear cataract, bilateral: Secondary | ICD-10-CM

## 2019-07-11 DIAGNOSIS — H353231 Exudative age-related macular degeneration, bilateral, with active choroidal neovascularization: Secondary | ICD-10-CM

## 2019-07-11 DIAGNOSIS — H43813 Vitreous degeneration, bilateral: Secondary | ICD-10-CM

## 2019-07-15 NOTE — Telephone Encounter (Signed)
**Note De-Identified Austin Mcgrath Obfuscation** Per note from 04/27/2019: I s/w [place] at [employer] who advised me that the pts [name] re-enrollment starts tomorrow and is active until April 27, 2020.

## 2019-07-21 ENCOUNTER — Ambulatory Visit: Payer: Medicare Other | Attending: Family Medicine | Admitting: Physical Therapy

## 2019-07-21 ENCOUNTER — Other Ambulatory Visit: Payer: Self-pay

## 2019-07-21 DIAGNOSIS — M25511 Pain in right shoulder: Secondary | ICD-10-CM | POA: Insufficient documentation

## 2019-07-21 DIAGNOSIS — M542 Cervicalgia: Secondary | ICD-10-CM | POA: Diagnosis not present

## 2019-07-21 DIAGNOSIS — M62838 Other muscle spasm: Secondary | ICD-10-CM | POA: Insufficient documentation

## 2019-07-21 DIAGNOSIS — M6281 Muscle weakness (generalized): Secondary | ICD-10-CM | POA: Diagnosis present

## 2019-07-21 NOTE — Therapy (Signed)
[facility] [address] [place], [place], [address] Phone: [phone]   Fax:  [phone]  Physical Therapy Evaluation  Patient Details  Name: Austin Mcgrath MRN: [id number] Date of Birth: 26-Jun-1941 Referring Provider (PT): Lujean Amel, MD   Encounter Date: 07/21/2019  PT End of Session - 07/21/19 1210    Visit Number  1    Date for PT Re-Evaluation  09/01/19    PT Start Time  1103    PT Stop Time  1148    PT Time Calculation (min)  45 min    Activity Tolerance  Patient tolerated treatment well;Patient limited by pain    Behavior During Therapy  [facility] for tasks assessed/performed       Past Medical History:  Diagnosis Date  . Aortic valve sclerosis    No etenosis Echo 2012 Mild TR  . Arteriosclerotic coronary artery disease    nonobstructive/Mild AI -DR Radford Pax  . Atrial flutter, paroxysmal ([place]) 05/29/2015   CHADS2VASC score is 4 on chronic anticoaguation with DOAC  . BPH (benign prostatic hypertrophy)   . Degenerative disc disease    Dr Cyndy Freeze  . 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 Burnett Harry  . Dyslipidemia    elevated cholesterol-Dr Radford Pax  . Gout    Dr Justine Null  . 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 Hypertension   . Macular degeneration    mild hypertensive retinopaty,macular degeneration,referred to retina   . OA (osteoarthritis)    Dr Justine Null  . OSA (obstructive sleep apnea)    severe with AHI 43/hr now on BiPAP at 13/9cm H2O  . Screening for AAA (aortic abdominal aneurysm) 02/2012   No aneurysm  . TR (tricuspid regurgitation)    Mild  . Tubular adenoma of colon    colonoscopy 05/2011 repeat in 5 years -Dr. Watt Climes    Past Surgical History:  Procedure Laterality Date  . APPENDECTOMY    . BUNIONECTOMY    . CARDIAC CATHETERIZATION N/A 04/27/2015   Procedure: Left Heart Cath and Coronary Angiography;  Surgeon: Jettie Booze, MD;  Location: [facility];   Service: Cardiovascular;  Laterality: N/A;  . foot sx  2006  . HERNIA REPAIR  Age 62  . L-Disk L5-S1 (right)  1993    There were no vitals filed for this visit.   Subjective Assessment - 07/21/19 1117    Subjective  Two weeks ago I woke up with this pain two weeks ago on Tuesday. Pt has been going to a chiropractor for neck pain for years.  Doctor told him to stop at this time, so he has not been back for the last two weeks.    Limitations  Lifting;Sitting    Patient Stated Goals  get pain to go away    Currently in Pain?  Yes    Pain Score  10-Worst pain ever   states can't imagine it being worse   Pain Location  Neck    Pain Orientation  Right    Pain Descriptors / Indicators  Sharp    Pain Radiating Towards  shoulder around shoulder blade and around ribcage across chest    Pain Onset  1 to 4 weeks ago    Aggravating Factors   lifting up, looking up    Pain Relieving Factors  nothing much, sometimes arm behind head or pain med but doesn't help much    Effect of Pain on Daily Activities  can't  do anything even lifting a glass    Multiple Pain Sites  No         [facility] PT Assessment - 07/21/19 0001      Assessment   Medical Diagnosis  cervical radiculopathy    Referring Provider (PT)  Koirala, Dibas, MD    Prior Therapy  No - was going to chiro      Precautions   Precautions  None      Restrictions   Weight Bearing Restrictions  No      Balance Screen   Has the patient fallen in the past 6 months  No      [place] residence    Living Arrangements  Spouse/significant other      Prior Function   Level of [place]  Retired      Associate Professor   Overall Cognitive Status  Within Functional Limits for tasks assessed      Observation/Other Assessments   Observations  cannot sit still due to pain/discomfort during the history    Focus on Therapeutic Outcomes (FOTO)   65% limited      Posture/Postural  Control   Posture/Postural Control  Postural limitations    Postural Limitations  Rounded Shoulders;Forward head      ROM / Strength   AROM / PROM / Strength  AROM;PROM;Strength      AROM   AROM Assessment Site  Shoulder;Cervical    Right/Left Shoulder  Right    Right Shoulder Flexion  88 Degrees    Right Shoulder ABduction  101 Degrees    Cervical Flexion  WFL    Cervical Extension  20    Cervical - Right Side Bend  10    Cervical - Left Side Bend  10    Cervical - Right Rotation  20    Cervical - Left Rotation  60      Strength   Overall Strength Comments  Rt elbow 4/5+pain, Rt shoulder 3-/5 due to pain    Strength Assessment Site  Hand;Shoulder    Right/Left Shoulder  Right    Right Shoulder Flexion  3-/5    Right Shoulder Extension  4/5    Right Shoulder ABduction  3-/5    Right Hand Grip (lbs)  69    Left Hand Grip (lbs)  78      Palpation   Palpation comment  tight and TTP Rt upper trap, rhomboids, suboccipitals      Special Tests    Special Tests  Cervical    Cervical Tests  Spurling's;Dictraction      Spurling's   Findings  Positive    Comment  bilat Rt>Lt      Distraction Test   Comment  decreased pain in semireclined      Ambulation/Gait   Gait Pattern  Within Functional Limits                Objective measurements completed on examination: See above findings.      [place] Adult PT Treatment/Exercise - 07/21/19 0001      Self-Care   Self-Care  Other Self-Care Comments    Other Self-Care Comments   intial HEP      Manual Therapy   Manual therapy comments  cervical traction - manual with cervical flexion; PROM cervical rotation Lt             PT Education - 07/21/19 1211    Education Details  [id number]    Person(s) Educated  Patient    Methods  Explanation;Demonstration;Verbal cues;Handout;Tactile cues    Comprehension  Verbalized understanding;Returned demonstration       PT Short Term Goals - 07/21/19 1209      PT SHORT  TERM GOAL #1   Title  ind with initial HEP    Time  3    Period  Weeks    Status  New    Target Date  08/11/19      PT SHORT TERM GOAL #2   Title  pt will report at least 30% less pain    Time  3    Period  Weeks    Status  New    Target Date  08/11/19      PT SHORT TERM GOAL #3   Title  Pt will be able to eat and drink with Rt UE without increased pain    Time  3    Period  Weeks    Status  New    Target Date  08/11/19        PT Long Term Goals - 07/21/19 1159      PT LONG TERM GOAL #1   Title  Pt will be ind with advanced HEP    Time  6    Period  Weeks    Status  New    Target Date  09/01/19      PT LONG TERM GOAL #2   Title  Pt will report </= to 41% limited    Baseline  65% limited    Time  6    Period  Weeks    Status  New    Target Date  09/01/19      PT LONG TERM GOAL #3   Title  Pt will demonstrate at least 55 deg Rt cervical rotation for improved ability to perform functional tasks such as driving    Time  6    Period  Weeks    Status  New    Target Date  09/01/19      PT LONG TERM GOAL #4   Title  Pt will demonstrate Rt shoulder flexion of at least 150 degrees for ability to reach things in shelf overhead    Time  6    Period  Weeks    Status  New    Target Date  09/01/19      PT LONG TERM GOAL #5   Title  Pt will report at least 75% less pain with functional daily tasks    Time  6    Period  Weeks    Status  New    Target Date  09/01/19             Plan - 07/21/19 1220    Clinical Impression Statement  Pt present to clinic with acute cervial radiculopathy and pain radiating down Rt UE into his hand and around ribcage to his chest on the Rt side.  Pt has a hard time getting comfortable and reports 10/10 pain.  Pain increases with Spurlings test bilateral but more severe to the Rt.  Pain and limited cervical ROM and strength deficits as mentioned above.  Pt is very TTP Rt>Lt  muscles in cervical region.  His pain eased with ROM to left  cervical rotation and flexion.  Pt appears to have nerve pain radiating from the cervical region causing pain and weakness throughout Rt upper extremity.  Pt has limited Rt shoulder AROM  due to pain.  Pt will benefit from skilled PT to address these impairments so he can return to normal function using his dominant side.    Personal Factors and Comorbidities  Age;Comorbidity 2    Comorbidities  chronic neck pain, pain and weakness effecting multiple body regions    Examination-Activity Limitations  Reach Overhead;Sleep;Carry;Dressing;Hygiene/Grooming;Lift    Stability/Clinical Decision Making  Evolving/Moderate complexity    Clinical Decision Making  Moderate    Rehab Potential  Excellent    PT Frequency  2x / week    PT Duration  6 weeks    PT Treatment/Interventions  ADLs/Self Care Home Management;Biofeedback;Cryotherapy;Electrical Stimulation;Iontophoresis 4mg /ml Dexamethasone;Moist Heat;Traction;Therapeutic activities;Therapeutic exercise;Neuromuscular re-education;Patient/family education;Manual techniques;Taping;Dry needling;Passive range of motion    PT Next Visit Plan  cervical traction with flexion, myofascial or STM, possibly DN - need to discuss with patient and if he can tolerate position, cervical and UE ROM    PT Home Exercise Plan  [id number]    Consulted and Agree with Plan of Care  Patient       Patient will benefit from skilled therapeutic intervention in order to improve the following deficits and impairments:  Impaired UE functional use, Pain, Decreased range of motion, Postural dysfunction, Increased muscle spasms, Decreased strength  Visit Diagnosis: Cervicalgia  Muscle weakness (generalized)  Acute pain of right shoulder  Other muscle spasm     Problem List Patient Active Problem List   Diagnosis Date Noted  . Permanent atrial fibrillation ([place]) 05/29/2015  . Abnormal nuclear stress test   . OSA (obstructive sleep apnea) 04/10/2014  . Bradycardia, drug induced  04/10/2014  . Hypersomnia 10/20/2013  . Coronary atherosclerosis of native coronary artery 10/04/2013  . Hyperlipidemia 10/04/2013  . Benign essential HTN 10/04/2013    Jule Ser, PT 07/21/2019, 3:12 PM  [facility] [address], [place], [place], [address] Phone: [phone]   Fax:  [phone]  Name: Austin Mcgrath MRN: [id number] Date of Birth: 01/11/1941

## 2019-07-21 NOTE — Patient Instructions (Signed)
[facility] [id number] Cervical ROM

## 2019-07-22 ENCOUNTER — Encounter: Payer: Self-pay | Admitting: Physical Therapy

## 2019-07-22 ENCOUNTER — Other Ambulatory Visit: Payer: Self-pay

## 2019-07-22 ENCOUNTER — Ambulatory Visit: Payer: Medicare Other | Admitting: Physical Therapy

## 2019-07-22 DIAGNOSIS — M542 Cervicalgia: Secondary | ICD-10-CM | POA: Diagnosis not present

## 2019-07-22 DIAGNOSIS — M25511 Pain in right shoulder: Secondary | ICD-10-CM

## 2019-07-22 DIAGNOSIS — M62838 Other muscle spasm: Secondary | ICD-10-CM

## 2019-07-22 DIAGNOSIS — M6281 Muscle weakness (generalized): Secondary | ICD-10-CM

## 2019-07-22 NOTE — Therapy (Signed)
[facility] [address], [place], [place], [address] Phone: [phone]   Fax:  [phone]  Physical Therapy Treatment  Patient Details  Name: Austin Mcgrath MRN: [id number] Date of Birth: [birth year] Referring Provider (PT): Lujean Amel, MD   Encounter Date: 07/22/2019  PT End of Session - 07/22/19 1014    Visit Number  2    Date for PT Re-Evaluation  09/01/19    PT Start Time  0930    PT Stop Time  1010    PT Time Calculation (min)  40 min    Activity Tolerance  Patient tolerated treatment well;No increased pain    Behavior During Therapy  WFL for tasks assessed/performed       Past Medical History:  Diagnosis Date  . Aortic valve sclerosis    No etenosis Echo 2012 Mild TR  . Arteriosclerotic coronary artery disease    nonobstructive/Mild AI -DR Radford Pax  . Atrial flutter, paroxysmal ([place]) 05/29/2015   CHADS2VASC score is 4 on chronic anticoaguation with DOAC  . BPH (benign prostatic hypertrophy)   . Degenerative disc disease    Dr Cyndy Freeze  . 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 Burnett Harry  . Dyslipidemia    elevated cholesterol-Dr Radford Pax  . Gout    Dr Justine Null  . 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 Hypertension   . Macular degeneration    mild hypertensive retinopaty,macular degeneration,referred to retina   . OA (osteoarthritis)    Dr Justine Null  . OSA (obstructive sleep apnea)    severe with AHI 43/hr now on BiPAP at 13/9cm H2O  . Screening for AAA (aortic abdominal aneurysm) 02/2012   No aneurysm  . TR (tricuspid regurgitation)    Mild  . Tubular adenoma of colon    colonoscopy 05/2011 repeat in 5 years -Dr. Watt Climes    Past Surgical History:  Procedure Laterality Date  . APPENDECTOMY    . BUNIONECTOMY    . CARDIAC CATHETERIZATION N/A 04/27/2015   Procedure: Left Heart Cath and Coronary Angiography;  Surgeon: Jettie Booze, MD;  Location: [facility];   Service: Cardiovascular;  Laterality: N/A;  . foot sx  2006  . HERNIA REPAIR  Age 78  . L-Disk L5-S1 (right)  1993    There were no vitals filed for this visit.  Subjective Assessment - 07/22/19 0934    Subjective  NO changes since initial evaluation. I still have pain.    Limitations  Lifting;Sitting    Patient Stated Goals  get pain to go away    Currently in Pain?  Yes    Pain Score  9     Pain Location  Neck    Pain Orientation  Right    Pain Descriptors / Indicators  Sharp    Pain Type  Chronic pain    Pain Radiating Towards  shoulder around shoulder blade and around ribcage across chest    Pain Onset  1 to 4 weeks ago    Pain Frequency  Constant    Aggravating Factors   lifting up, looking up    Pain Relieving Factors  nothing much, sometimes arm behind head or pain med but doesen't help much    Multiple Pain Sites  No                       [facility] Adult PT Treatment/Exercise - 07/22/19 0001      Neuro  Re-ed    Neuro Re-ed Details   chin tuck to elongate cervical and mobilize the upper spine 5 times every hour      Manual Therapy   Manual Therapy  Neural Stretch;Soft tissue mobilization;Joint mobilization;Manual Traction    Joint Mobilization  gentle sideglide of C2-C7 on bilateral sides in supine; supine right rib mobilzation for anterior posterior motion and downward motion; sitting rotational mobilization to T1-T7 and anterior posterior motion with lots of pop sounds    Soft tissue mobilization  cervical paraspinals, suboccipitals, upper thoracic, right biceps    Manual Traction  manual cervical traction for 2 minutes    Neural Stretch  right arm for brachial pelxus, ulnar nerve and median nerve with pain decreasing with technique             PT Education - 07/22/19 1013    Education Details  chin retraction every hour 5 times    Person(s) Educated  Patient    Methods  Explanation;Demonstration    Comprehension  Verbalized understanding;Returned  demonstration       PT Short Term Goals - 07/21/19 1209      PT SHORT TERM GOAL #1   Title  ind with initial HEP    Time  3    Period  Weeks    Status  New    Target Date  08/11/19      PT SHORT TERM GOAL #2   Title  pt will report at least 30% less pain    Time  3    Period  Weeks    Status  New    Target Date  08/11/19      PT SHORT TERM GOAL #3   Title  Pt will be able to eat and drink with Rt UE without increased pain    Time  3    Period  Weeks    Status  New    Target Date  08/11/19        PT Long Term Goals - 07/21/19 1159      PT LONG TERM GOAL #1   Title  Pt will be ind with advanced HEP    Time  6    Period  Weeks    Status  New    Target Date  09/01/19      PT LONG TERM GOAL #2   Title  Pt will report </= to 41% limited    Baseline  65% limited    Time  6    Period  Weeks    Status  New    Target Date  09/01/19      PT LONG TERM GOAL #3   Title  Pt will demonstrate at least 55 deg Rt cervical rotation for improved ability to perform functional tasks such as driving    Time  6    Period  Weeks    Status  New    Target Date  09/01/19      PT LONG TERM GOAL #4   Title  Pt will demonstrate Rt shoulder flexion of at least 150 degrees for ability to reach things in shelf overhead    Time  6    Period  Weeks    Status  New    Target Date  09/01/19      PT LONG TERM GOAL #5   Title  Pt will report at least 75% less pain with functional daily tasks    Time  6  Period  Weeks    Status  New    Target Date  09/01/19            Plan - 07/22/19 1014    Clinical Impression Statement  After manual work pain decreased by 50% to 4/10 and able to sit upright better. Patient has decreased mobility of the right rib cage, cervical and mid to upper thoracic. Patient feels better with his head forward in sitting. Patient lays supine with head elevated. Patient will benefit from skilled therapy to improve cervcial thoracic movement while reducing pain.     Personal Factors and Comorbidities  Age;Comorbidity 2    Comorbidities  chronic neck pain, pain and weakness effecting multiple body regions    Examination-Activity Limitations  Reach Overhead;Sleep;Carry;Dressing;Hygiene/Grooming;Lift    Stability/Clinical Decision Making  Evolving/Moderate complexity    Rehab Potential  Excellent    PT Frequency  2x / week    PT Duration  6 weeks    PT Treatment/Interventions  ADLs/Self Care Home Management;Biofeedback;Cryotherapy;Electrical Stimulation;Iontophoresis 4mg /ml Dexamethasone;Moist Heat;Traction;Therapeutic activities;Therapeutic exercise;Neuromuscular re-education;Patient/family education;Manual techniques;Taping;Dry needling;Passive range of motion    PT Next Visit Plan  cervical traction with flexion, myofascial or STM, possibly DN - need to discuss with patient and if he can tolerate position, cervical and UE ROM; joint mobilization to cervical and thoracic; give nerural tension stretch to righ tUE for HEP    PT Home Exercise Plan  [id number]    Consulted and Agree with Plan of Care  Patient       Patient will benefit from skilled therapeutic intervention in order to improve the following deficits and impairments:  Impaired UE functional use, Pain, Decreased range of motion, Postural dysfunction, Increased muscle spasms, Decreased strength  Visit Diagnosis: Cervicalgia  Muscle weakness (generalized)  Acute pain of right shoulder  Other muscle spasm     Problem List Patient Active Problem List   Diagnosis Date Noted  . Permanent atrial fibrillation ([place]) 05/29/2015  . Abnormal nuclear stress test   . OSA (obstructive sleep apnea) 04/10/2014  . Bradycardia, drug induced 04/10/2014  . Hypersomnia 10/20/2013  . Coronary atherosclerosis of native coronary artery 10/04/2013  . Hyperlipidemia 10/04/2013  . Benign essential HTN 10/04/2013    Earlie Counts, PT 07/22/19 10:18 AM    [facility] [address], [place], [place], [address] Phone: [phone]   Fax:  [phone]  Name: Austin Mcgrath MRN: [id number] Date of Birth: 12-21-1940

## 2019-07-27 ENCOUNTER — Ambulatory Visit: Payer: Medicare Other | Admitting: Physical Therapy

## 2019-07-27 ENCOUNTER — Encounter: Payer: Self-pay | Admitting: Physical Therapy

## 2019-07-27 ENCOUNTER — Other Ambulatory Visit: Payer: Self-pay

## 2019-07-27 DIAGNOSIS — M62838 Other muscle spasm: Secondary | ICD-10-CM

## 2019-07-27 DIAGNOSIS — M542 Cervicalgia: Secondary | ICD-10-CM | POA: Diagnosis not present

## 2019-07-27 DIAGNOSIS — M25511 Pain in right shoulder: Secondary | ICD-10-CM

## 2019-07-27 DIAGNOSIS — M6281 Muscle weakness (generalized): Secondary | ICD-10-CM

## 2019-07-27 NOTE — Therapy (Signed)
[name] [address] [place], [place], [address] Phone: [phone]   Fax:  [phone]  Physical Therapy Treatment  Patient Details  Name: Austin Mcgrath MRN: [id number] Date of Birth: 03/29/1941 Referring Provider (PT): Lujean Amel, MD   Encounter Date: 07/27/2019  PT End of Session - 07/27/19 1226    Visit Number  3    Date for PT Re-Evaluation  09/01/19    Authorization Type  medicare    PT Start Time  [id number]    PT Stop Time  1223    PT Time Calculation (min)  38 min    Activity Tolerance  Patient limited by pain    Behavior During Therapy  [facility] for tasks assessed/performed       Past Medical History:  Diagnosis Date  . Aortic valve sclerosis    No etenosis Echo 2012 Mild TR  . Arteriosclerotic coronary artery disease    nonobstructive/Mild AI -DR Radford Pax  . Atrial flutter, paroxysmal ([place]) 05/29/2015   CHADS2VASC score is 4 on chronic anticoaguation with DOAC  . BPH (benign prostatic hypertrophy)   . Degenerative disc disease    Dr Cyndy Freeze  . Diabetes mellitus without complication ([place])    Type II w/ microalbuminuria-Dr. Burnett Harry  . Dyslipidemia    elevated cholesterol-Dr Radford Pax  . Gout    Dr Justine Null  . 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 Hypertension   . Macular degeneration    mild hypertensive retinopaty,macular degeneration,referred to retina   . OA (osteoarthritis)    Dr Justine Null  . OSA (obstructive sleep apnea)    severe with AHI 43/hr now on BiPAP at 13/9cm H2O  . Screening for AAA (aortic abdominal aneurysm) 02/2012   No aneurysm  . TR (tricuspid regurgitation)    Mild  . Tubular adenoma of colon    colonoscopy 05/2011 repeat in 5 years -Dr. Watt Climes    Past Surgical History:  Procedure Laterality Date  . APPENDECTOMY    . BUNIONECTOMY    . CARDIAC CATHETERIZATION N/A 04/27/2015   Procedure: Left Heart Cath and Coronary Angiography;  Surgeon: Jettie Booze, MD;  Location: [facility];   Service: Cardiovascular;  Laterality: N/A;  . foot sx  2006  . HERNIA REPAIR  Age 78  . L-Disk L5-S1 (right)  1993    There were no vitals filed for this visit.  Subjective Assessment - 07/27/19 1149    Subjective  Still has right shoulder pain. Neck has some tenderness or stiffness. Right rib cage pain is 60% better.    Limitations  Lifting;Sitting    Patient Stated Goals  get pain to go away    Currently in Pain?  Yes    Pain Score  4     Pain Location  Neck    Pain Orientation  Right    Pain Descriptors / Indicators  Tightness    Pain Type  Chronic pain    Pain Radiating Towards  shoulder around shoulder blade and around ribcage across chest    Pain Onset  1 to 4 weeks ago    Pain Frequency  Constant    Aggravating Factors   lifting up , looking up    Pain Relieving Factors  not turning his head, looking straight ahead                       [facility] Adult PT Treatment/Exercise - 07/27/19 0001  Neuro Re-ed    Neuro Re-ed Details   neural tension stretch to the right UE for radial and median nerve      Modalities   Modalities  Traction      Traction   Type of Traction  Cervical    Min (lbs)  5    Max (lbs)  13    Time  tried 2 minutes but right arm pain increased to 9/10      Manual Therapy   Manual Therapy  Soft tissue mobilization;Joint mobilization    Joint Mobilization  rotational and side glide to C1-T6 in sitting     Soft tissue mobilization  cervical and thoracic paraspinals with addaday in sitting, then did soft tissue work to cervical after traction to calk down the pain but right arm pain will decrease with cervical flexed but once cervical is retracted the pain will increase in the right arm             PT Education - 07/27/19 1226    Education Details  dicussed with patient to call the doctor due to the pain increased with cervical traction and manual work today    [employer]) Educated  Patient    Methods  Explanation    Comprehension   Verbalized understanding       PT Short Term Goals - 07/21/19 1209      PT SHORT TERM GOAL #1   Title  ind with initial HEP    Time  3    Period  Weeks    Status  New    Target Date  08/11/19      PT SHORT TERM GOAL #2   Title  pt will report at least 30% less pain    Time  3    Period  Weeks    Status  New    Target Date  08/11/19      PT SHORT TERM GOAL #3   Title  Pt will be able to eat and drink with Rt UE without increased pain    Time  3    Period  Weeks    Status  New    Target Date  08/11/19        PT Long Term Goals - 07/21/19 1159      PT LONG TERM GOAL #1   Title  Pt will be ind with advanced HEP    Time  6    Period  Weeks    Status  New    Target Date  09/01/19      PT LONG TERM GOAL #2   Title  Pt will report </= to 41% limited    Baseline  65% limited    Time  6    Period  Weeks    Status  New    Target Date  09/01/19      PT LONG TERM GOAL #3   Title  Pt will demonstrate at least 55 deg Rt cervical rotation for improved ability to perform functional tasks such as driving    Time  6    Period  Weeks    Status  New    Target Date  09/01/19      PT LONG TERM GOAL #4   Title  Pt will demonstrate Rt shoulder flexion of at least 150 degrees for ability to reach things in shelf overhead    Time  6    Period  Weeks    Status  New  Target Date  09/01/19      PT LONG TERM GOAL #5   Title  Pt will report at least 75% less pain with functional daily tasks    Time  6    Period  Weeks    Status  New    Target Date  09/01/19            Plan - 07/27/19 1152    Clinical Impression Statement  Patient came in with d0% less right rib cage pain and next 20% better but right arm became worse during therapy. Patient pain increased in right arm with cervcial traction to 9/10. Patient has increased pain with manual work in sitting. Patient pain is worse with cervical retraction and better with flexion. Suggested to patient about calling doctor due  to right arm pain. Patient will benefit from skilled therapy for pain relief with a therapist monitoring.    Personal Factors and Comorbidities  Age;Comorbidity 2    Comorbidities  chronic neck pain, pain and weakness effecting multiple body regions    Examination-Activity Limitations  Reach Overhead;Sleep;Carry;Dressing;Hygiene/Grooming;Lift    Stability/Clinical Decision Making  Evolving/Moderate complexity    Rehab Potential  Excellent    PT Frequency  2x / week    PT Duration  6 weeks    PT Treatment/Interventions  ADLs/Self Care Home Management;Biofeedback;Cryotherapy;Electrical Stimulation;Iontophoresis 4mg /ml Dexamethasone;Moist Heat;Traction;Therapeutic activities;Therapeutic exercise;Neuromuscular re-education;Patient/family education;Manual techniques;Taping;Dry needling;Passive range of motion    PT Next Visit Plan  see if he has called the MD, not tolerating cervical traction, try reclined position with right SB of cervical, Neural tension stretch of right UE    PT Home Exercise Plan  [id number]    Recommended Other Services  MD signed intial note    Consulted and Agree with Plan of Care  Patient       Patient will benefit from skilled therapeutic intervention in order to improve the following deficits and impairments:  Impaired UE functional use, Pain, Decreased range of motion, Postural dysfunction, Increased muscle spasms, Decreased strength  Visit Diagnosis: Cervicalgia  Muscle weakness (generalized)  Acute pain of right shoulder  Other muscle spasm     Problem List Patient Active Problem List   Diagnosis Date Noted  . Permanent atrial fibrillation ([place]) 05/29/2015  . Abnormal nuclear stress test   . OSA (obstructive sleep apnea) 04/10/2014  . Bradycardia, drug induced 04/10/2014  . Hypersomnia 10/20/2013  . Coronary atherosclerosis of native coronary artery 10/04/2013  . Hyperlipidemia 10/04/2013  . Benign essential HTN 10/04/2013    Earlie Counts, PT 07/27/19  12:32 PM   [facility] [address] [place], [place], [address] Phone: [phone]   Fax:  [phone]  Name: MOHMMAD THISSELL MRN: [id number] Date of Birth: Feb 12, 1941

## 2019-08-01 ENCOUNTER — Other Ambulatory Visit: Payer: Self-pay | Admitting: Family Medicine

## 2019-08-01 ENCOUNTER — Telehealth: Payer: Self-pay

## 2019-08-01 DIAGNOSIS — M5412 Radiculopathy, cervical region: Secondary | ICD-10-CM

## 2019-08-01 NOTE — Telephone Encounter (Signed)
**Note De-Identified Carleigh Buccieri Obfuscation** Per  [facility] message received from the pts daughter Caren Macadam [employer] and requested the pts next shipment of [name]. Per Tayah with [employer] the pts [name] should arrive in the office within 7 to 10 business days.  ID: [id number] Order ID: [id number]  Next refill date is 10/08/2019 per Tayah.

## 2019-08-02 ENCOUNTER — Encounter: Payer: Self-pay | Admitting: Physical Therapy

## 2019-08-02 ENCOUNTER — Ambulatory Visit: Payer: Medicare Other | Admitting: Physical Therapy

## 2019-08-02 ENCOUNTER — Other Ambulatory Visit: Payer: Self-pay

## 2019-08-02 DIAGNOSIS — M542 Cervicalgia: Secondary | ICD-10-CM | POA: Diagnosis not present

## 2019-08-02 DIAGNOSIS — M6281 Muscle weakness (generalized): Secondary | ICD-10-CM

## 2019-08-02 NOTE — Therapy (Signed)
[facility] [address] [place], [place], [address] Phone: [phone]   Fax:  [phone]  Physical Therapy Treatment  Patient Details  Name: Austin Mcgrath MRN: [id number] Date of Birth: [birth year] Referring Provider (PT): Lujean Amel, MD   Encounter Date: 08/02/2019  PT End of Session - 08/02/19 0914    Visit Number  4    Date for PT Re-Evaluation  09/01/19    Authorization Type  medicare    PT Start Time  0849    PT Stop Time  0920   shorted session secondary to limited progress   PT Time Calculation (min)  31 min    Activity Tolerance  Patient limited by pain       Past Medical History:  Diagnosis Date  . Aortic valve sclerosis    No etenosis Echo 2012 Mild TR  . Arteriosclerotic coronary artery disease    nonobstructive/Mild AI -DR Radford Pax  . Atrial flutter, paroxysmal ([place]) 05/29/2015   CHADS2VASC score is 4 on chronic anticoaguation with DOAC  . BPH (benign prostatic hypertrophy)   . Degenerative disc disease    Dr Cyndy Freeze  . Diabetes mellitus without complication ([place])    Type II w/ microalbuminuria-Dr. Burnett Harry  . Dyslipidemia    elevated cholesterol-Dr Radford Pax  . Gout    Dr Justine Null  . 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 Hypertension   . Macular degeneration    mild hypertensive retinopaty,macular degeneration,referred to retina   . OA (osteoarthritis)    Dr Justine Null  . OSA (obstructive sleep apnea)    severe with AHI 43/hr now on BiPAP at 13/9cm H2O  . Screening for AAA (aortic abdominal aneurysm) 02/2012   No aneurysm  . TR (tricuspid regurgitation)    Mild  . Tubular adenoma of colon    colonoscopy 05/2011 repeat in 5 years -Dr. Watt Climes    Past Surgical History:  Procedure Laterality Date  . APPENDECTOMY    . BUNIONECTOMY    . CARDIAC CATHETERIZATION N/A 04/27/2015   Procedure: Left Heart Cath and Coronary Angiography;  Surgeon: Jettie Booze, MD;  Location: [facility];  Service:  Cardiovascular;  Laterality: N/A;  . foot sx  2006  . HERNIA REPAIR  Age 76  . L-Disk L5-S1 (right)  1993    There were no vitals filed for this visit.  Subjective Assessment - 08/02/19 0849    Subjective  Constant right shoulder blade pain and right UE down to fingers.   It's starting to get ramped up this morning.    Sometimes putting arm above head eases off but then it returns.   Lost strength in arm.  Arm shakes with holding a glass.    Scheduled for an MRI Thursday.   Right sidelying helps but then afterward it worsens.    Currently in Pain?  Yes    Pain Score  5     Pain Location  Neck    Pain Orientation  Right    Pain Type  Chronic pain    Pain Radiating Towards  right UE    Aggravating Factors   looking up    Pain Relieving Factors  right sidelying, arm overhead                       [facility] Adult PT Treatment/Exercise - 08/02/19 0001      Self-Care   Other Self-Care Comments   has home TENs but states it  doesn't help;  use of heat;  positions of comfort       Neck Exercises: Seated   Other Seated Exercise  self fist traction       Moist Heat Therapy   Number Minutes Moist Heat  20 Minutes    Moist Heat Location  Cervical   prior to and concurrent with traction      Traction   Type of Traction  Cervical    Min (lbs)  6    Max (lbs)  12    Hold Time  60    Rest Time  15    Time  15               PT Short Term Goals - 07/21/19 1209      PT SHORT TERM GOAL #1   Title  ind with initial HEP    Time  3    Period  Weeks    Status  New    Target Date  08/11/19      PT SHORT TERM GOAL #2   Title  pt will report at least 30% less pain    Time  3    Period  Weeks    Status  New    Target Date  08/11/19      PT SHORT TERM GOAL #3   Title  Pt will be able to eat and drink with Rt UE without increased pain    Time  3    Period  Weeks    Status  New    Target Date  08/11/19        PT Long Term Goals - 07/21/19 1159      PT LONG  TERM GOAL #1   Title  Pt will be ind with advanced HEP    Time  6    Period  Weeks    Status  New    Target Date  09/01/19      PT LONG TERM GOAL #2   Title  Pt will report </= to 41% limited    Baseline  65% limited    Time  6    Period  Weeks    Status  New    Target Date  09/01/19      PT LONG TERM GOAL #3   Title  Pt will demonstrate at least 55 deg Rt cervical rotation for improved ability to perform functional tasks such as driving    Time  6    Period  Weeks    Status  New    Target Date  09/01/19      PT LONG TERM GOAL #4   Title  Pt will demonstrate Rt shoulder flexion of at least 150 degrees for ability to reach things in shelf overhead    Time  6    Period  Weeks    Status  New    Target Date  09/01/19      PT LONG TERM GOAL #5   Title  Pt will report at least 75% less pain with functional daily tasks    Time  6    Period  Weeks    Status  New    Target Date  09/01/19            Plan - 08/02/19 1840    Clinical Impression Statement  The patient reports constant UE symptoms which has not responded well manual techniques and exercise.  Initiated trial of mechanical cervical traction at  a low level of pull secondary to symptoms sensitivity.  During traction and immediately following, he reports decreased neck pain but no initial changes in UE.  Therapist closely monitoring response throughout session.  The patient is scheduled for MRI on Thursday.    Comorbidities  chronic neck pain, pain and weakness effecting multiple body regions    Rehab Potential  Excellent    PT Frequency  2x / week    PT Duration  6 weeks    PT Treatment/Interventions  ADLs/Self Care Home Management;Biofeedback;Cryotherapy;Electrical Stimulation;Iontophoresis 4mg /ml Dexamethasone;Moist Heat;Traction;Therapeutic activities;Therapeutic exercise;Neuromuscular re-education;Patient/family education;Manual techniques;Taping;Dry needling;Passive range of motion    PT Next Visit Plan  assess  response to  cervical traction, try reclined position with right SB of cervical, Neural tension stretch of right UE;  MRI on Thursday    PT Home Exercise Plan  [id number]       Patient will benefit from skilled therapeutic intervention in order to improve the following deficits and impairments:  Impaired UE functional use, Pain, Decreased range of motion, Postural dysfunction, Increased muscle spasms, Decreased strength  Visit Diagnosis: Cervicalgia  Muscle weakness (generalized)     Problem List Patient Active Problem List   Diagnosis Date Noted  . Permanent atrial fibrillation ([place]) 05/29/2015  . Abnormal nuclear stress test   . OSA (obstructive sleep apnea) 04/10/2014  . Bradycardia, drug induced 04/10/2014  . Hypersomnia 10/20/2013  . Coronary atherosclerosis of native coronary artery 10/04/2013  . Hyperlipidemia 10/04/2013  . Benign essential HTN 10/04/2013   Ruben Im, PT 08/02/19 6:47 PM Phone: [phone] Fax: [phone] Alvera Singh 08/02/2019, 6:46 PM  [facility] [address], [place], [place], [address] Phone: [phone]   Fax:  [phone]  Name: Austin Mcgrath MRN: [id number] Date of Birth: 01/28/1941

## 2019-08-04 ENCOUNTER — Ambulatory Visit: Payer: Medicare Other | Admitting: Physical Therapy

## 2019-08-04 ENCOUNTER — Other Ambulatory Visit: Payer: Medicare Other

## 2019-08-08 ENCOUNTER — Encounter (INDEPENDENT_AMBULATORY_CARE_PROVIDER_SITE_OTHER): Payer: Medicare Other | Admitting: Ophthalmology

## 2019-08-08 DIAGNOSIS — H353231 Exudative age-related macular degeneration, bilateral, with active choroidal neovascularization: Secondary | ICD-10-CM

## 2019-08-08 DIAGNOSIS — H35033 Hypertensive retinopathy, bilateral: Secondary | ICD-10-CM

## 2019-08-08 DIAGNOSIS — H43813 Vitreous degeneration, bilateral: Secondary | ICD-10-CM

## 2019-08-08 DIAGNOSIS — I10 Essential (primary) hypertension: Secondary | ICD-10-CM | POA: Diagnosis not present

## 2019-08-09 ENCOUNTER — Ambulatory Visit: Payer: Medicare Other | Attending: Family Medicine | Admitting: Physical Therapy

## 2019-08-09 ENCOUNTER — Encounter: Payer: Self-pay | Admitting: Physical Therapy

## 2019-08-09 ENCOUNTER — Other Ambulatory Visit: Payer: Self-pay

## 2019-08-09 DIAGNOSIS — M6281 Muscle weakness (generalized): Secondary | ICD-10-CM | POA: Insufficient documentation

## 2019-08-09 DIAGNOSIS — M62838 Other muscle spasm: Secondary | ICD-10-CM | POA: Diagnosis present

## 2019-08-09 DIAGNOSIS — M542 Cervicalgia: Secondary | ICD-10-CM

## 2019-08-09 DIAGNOSIS — M25511 Pain in right shoulder: Secondary | ICD-10-CM | POA: Diagnosis present
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
## 2019-08-11 ENCOUNTER — Ambulatory Visit: Payer: Medicare Other | Admitting: Physical Therapy

## 2019-08-11 NOTE — Telephone Encounter (Signed)
Called pt but did not get a answer, so I called pt's daughter Austin Mcgrath to inform her that the pt's medication [name] has arrive at the office for pt to pick up downstairs and if they have any other problems, questions or concerns to call the office. Daughter verbalized understanding.

## 2019-08-12 ENCOUNTER — Ambulatory Visit
Admission: RE | Admit: 2019-08-12 | Discharge: 2019-08-12 | Disposition: A | Discharge status: Home / Self Care | Payer: Medicare Other | Source: Ambulatory Visit | Attending: Family Medicine | Admitting: Family Medicine

## 2019-08-12 ENCOUNTER — Other Ambulatory Visit: Payer: Self-pay

## 2019-08-12 DIAGNOSIS — M5412 Radiculopathy, cervical region: Secondary | ICD-10-CM

## 2019-08-15 ENCOUNTER — Other Ambulatory Visit: Payer: Medicare Other

## 2019-08-16 ENCOUNTER — Ambulatory Visit: Payer: Medicare Other | Admitting: Physical Therapy

## 2019-08-16 ENCOUNTER — Other Ambulatory Visit: Payer: Self-pay

## 2019-08-16 DIAGNOSIS — M25511 Pain in right shoulder: Secondary | ICD-10-CM

## 2019-08-16 DIAGNOSIS — M542 Cervicalgia: Secondary | ICD-10-CM

## 2019-08-16 DIAGNOSIS — M62838 Other muscle spasm: Secondary | ICD-10-CM

## 2019-08-16 DIAGNOSIS — M6281 Muscle weakness (generalized): Secondary | ICD-10-CM

## 2019-08-16 NOTE — Therapy (Signed)
[employer] [employer] [facility] [address] [place], [place], [address] Phone: [phone]   Fax:  [phone]  Physical Therapy Treatment  Patient Details  Name: Austin Mcgrath MRN: [id number] Date of Birth: 1941/05/17 Referring Provider (PT): Lujean Amel, MD   Encounter Date: 08/16/2019  PT End of Session - 08/16/19 1007    Visit Number  6    Date for PT Re-Evaluation  09/01/19    Authorization Type  medicare    PT Start Time  0930    PT Stop Time  1020    PT Time Calculation (min)  50 min    Activity Tolerance  Patient limited by pain;Patient tolerated treatment well    Behavior During Therapy  [facility] for tasks assessed/performed       Past Medical History:  Diagnosis Date  . Aortic valve sclerosis    No etenosis Echo 2012 Mild TR  . Arteriosclerotic coronary artery disease    nonobstructive/Mild AI -DR Radford Pax  . Atrial flutter, paroxysmal ([place]) 05/29/2015   CHADS2VASC score is 4 on chronic anticoaguation with DOAC  . BPH (benign prostatic hypertrophy)   . Degenerative disc disease    Dr Cyndy Freeze  . Diabetes mellitus without complication ([place])    Type II w/ microalbuminuria-Dr. Burnett Harry  . Dyslipidemia    elevated cholesterol-Dr Radford Pax  . Gout    Dr Justine Null  . 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 Hypertension   . Macular degeneration    mild hypertensive retinopaty,macular degeneration,referred to retina   . OA (osteoarthritis)    Dr Justine Null  . OSA (obstructive sleep apnea)    severe with AHI 43/hr now on BiPAP at 13/9cm H2O  . Screening for AAA (aortic abdominal aneurysm) 02/2012   No aneurysm  . TR (tricuspid regurgitation)    Mild  . Tubular adenoma of colon    colonoscopy 05/2011 repeat in 5 years -Dr. Watt Climes    Past Surgical History:  Procedure Laterality Date  . APPENDECTOMY    . BUNIONECTOMY    . CARDIAC CATHETERIZATION N/A 04/27/2015   Procedure: Left Heart Cath and Coronary Angiography;  Surgeon: Jettie Booze,  MD;  Location: [facility];  Service: Cardiovascular;  Laterality: N/A;  . foot sx  2006  . HERNIA REPAIR  Age 78  . L-Disk L5-S1 (right)  1993    There were no vitals filed for this visit.  Subjective Assessment - 08/16/19 0932    Subjective  I had the MRI on Friday and the doctor has not called me yet. the pain is not as strong. The main pain is in the shoulder blade shooting down the right arm.    Limitations  Lifting;Sitting    Patient Stated Goals  get pain to go away    Currently in Pain?  Yes    Pain Score  8     Pain Location  Arm    Pain Orientation  Right    Pain Descriptors / Indicators  Aching;Tingling;Discomfort;Constant;Shooting    Pain Type  Chronic pain    Pain Radiating Towards  right shoulder blade to right pinky finger    Pain Onset  1 to 4 weeks ago    Pain Frequency  Constant    Aggravating Factors   movement, sitting still    Pain Relieving Factors  keep right arm at his side and hold it with left hand    Multiple Pain Sites  No  [facility] PT Assessment - 08/16/19 0001      Assessment   Medical Diagnosis  cervical radiculopathy    Referring Provider (PT)  Koirala, Dibas, MD    Prior Therapy  No - was going to chiro      AROM   Right Shoulder Flexion  135 Degrees    Right Shoulder ABduction  135 Degrees      Strength   Right Hand Grip (lbs)  60   80#, 75#                  [facility] Adult PT Treatment/Exercise - 08/16/19 0001      Traction   Type of Traction  Cervical    Min (lbs)  8    Max (lbs)  18   head propped up on folder towel, pull amount may be diminish   Hold Time  60    Rest Time  15    Time  15      Manual Therapy   Manual Therapy  Soft tissue mobilization    Soft tissue mobilization  cervical paraspinlas, suboccipitals, right upper trap, right levator scapulae, right interscapular, right SCM       Trigger Point Dry Needling - 08/16/19 0001    Consent Given?  Yes    Education Handout Provided  Yes     Muscles Treated Head and Neck  Cervical multifidi;Upper trapezius   right   Muscles Treated Upper Quadrant  Rhomboids   right   Upper Trapezius Response  Twitch reponse elicited;Palpable increased muscle length    Cervical multifidi Response  Twitch reponse elicited;Palpable increased muscle length    Rhomboids Response  Twitch response elicited;Palpable increased muscle length           PT Education - 08/16/19 1006    Education Details  information on dry needling    Person(s) Educated  Patient    Methods  Explanation;Handout    Comprehension  Verbalized understanding       PT Short Term Goals - 08/16/19 1011      PT SHORT TERM GOAL #1   Title  ind with initial HEP    Time  3    Period  Weeks    Status  Achieved      PT SHORT TERM GOAL #2   Title  pt will report at least 30% less pain    Time  3    Period  Weeks    Status  On-going      PT SHORT TERM GOAL #3   Title  Pt will be able to eat and drink with Rt UE without increased pain    Period  Weeks    Status  On-going    Target Date  08/11/19        PT Long Term Goals - 07/21/19 1159      PT LONG TERM GOAL #1   Title  Pt will be ind with advanced HEP    Time  6    Period  Weeks    Status  New    Target Date  09/01/19      PT LONG TERM GOAL #2   Title  Pt will report </= to 41% limited    Baseline  65% limited    Time  6    Period  Weeks    Status  New    Target Date  09/01/19      PT LONG TERM GOAL #3   Title  Pt will demonstrate at least 55 deg Rt cervical rotation for improved ability to perform functional tasks such as driving    Time  6    Period  Weeks    Status  New    Target Date  09/01/19      PT LONG TERM GOAL #4   Title  Pt will demonstrate Rt shoulder flexion of at least 150 degrees for ability to reach things in shelf overhead    Time  6    Period  Weeks    Status  New    Target Date  09/01/19      PT LONG TERM GOAL #5   Title  Pt will report at least 75% less pain with  functional daily tasks    Time  6    Period  Weeks    Status  New    Target Date  09/01/19            Plan - 08/16/19 0931    Clinical Impression Statement  Patient has increased right shoulder flexion and abduction to 135 degrees. Patient grip strength on the right has increased. Patient is having less pain in cervical but continues to have pain down right arm. Patient is able to hold his head upright better. Patient has tolerated the dry needling and several trigger points in the cervical and upper trap. Patient has not found out what the results were for the MRI yet. Patient will benefit from skilled therapy to monitor his pain and progress his treatment.    Personal Factors and Comorbidities  Age;Comorbidity 2    Comorbidities  chronic neck pain, pain and weakness effecting multiple body regions    Examination-Activity Limitations  Reach Overhead;Sleep;Carry;Dressing;Hygiene/Grooming;Lift    Stability/Clinical Decision Making  Evolving/Moderate complexity    Rehab Potential  Excellent    PT Frequency  2x / week    PT Duration  6 weeks    PT Treatment/Interventions  ADLs/Self Care Home Management;Biofeedback;Cryotherapy;Electrical Stimulation;Iontophoresis 4mg /ml Dexamethasone;Moist Heat;Traction;Therapeutic activities;Therapeutic exercise;Neuromuscular re-education;Patient/family education;Manual techniques;Taping;Dry needling;Passive range of motion    PT Next Visit Plan  continue with cervical traction; see if dry needling has helped, see if he heard from the doctor, neural tension stretch, soft tissue work    PT [place] and Agree with Plan of Care  Patient       Patient will benefit from skilled therapeutic intervention in order to improve the following deficits and impairments:  Impaired UE functional use, Pain, Decreased range of motion, Postural dysfunction, Increased muscle spasms, Decreased strength  Visit Diagnosis: Cervicalgia  Muscle  weakness (generalized)  Acute pain of right shoulder  Other muscle spasm     Problem List Patient Active Problem List   Diagnosis Date Noted  . Permanent atrial fibrillation ([place]) 05/29/2015  . Abnormal nuclear stress test   . OSA (obstructive sleep apnea) 04/10/2014  . Bradycardia, drug induced 04/10/2014  . Hypersomnia 10/20/2013  . Coronary atherosclerosis of native coronary artery 10/04/2013  . Hyperlipidemia 10/04/2013  . Benign essential HTN 10/04/2013    Earlie Counts, PT 08/16/19 10:14 AM   [facility] [address] [place], [place], [address] Phone: [phone]   Fax:  [phone]  Name: DEATON BYNES MRN: [id number] Date of Birth: [birth year]

## 2019-08-16 NOTE — Patient Instructions (Signed)

## 2019-08-18 ENCOUNTER — Other Ambulatory Visit: Payer: Self-pay

## 2019-08-18 ENCOUNTER — Encounter: Payer: Self-pay | Admitting: Physical Therapy

## 2019-08-18 ENCOUNTER — Ambulatory Visit: Payer: Medicare Other | Admitting: Physical Therapy

## 2019-08-18 DIAGNOSIS — M6281 Muscle weakness (generalized): Secondary | ICD-10-CM

## 2019-08-18 DIAGNOSIS — M542 Cervicalgia: Secondary | ICD-10-CM | POA: Diagnosis not present

## 2019-08-18 DIAGNOSIS — M62838 Other muscle spasm: Secondary | ICD-10-CM

## 2019-08-18 DIAGNOSIS — M25511 Pain in right shoulder: Secondary | ICD-10-CM

## 2019-08-18 NOTE — Therapy (Addendum)
[facility] [address], [place], [place], [address] Phone: [phone]   Fax:  [phone]  Physical Therapy Treatment/Discharge Summary   Patient Details  Name: Austin Mcgrath MRN: [id number] Date of Birth: 10/27/1940 Referring Provider (PT): Lujean Amel, MD   Encounter Date: 08/18/2019  PT End of Session - 08/18/19 1703    Visit Number  7    Date for PT Re-Evaluation  09/01/19    Authorization Type  medicare    PT Start Time  0930    PT Stop Time  1005   low tolerance for interventions   PT Time Calculation (min)  35 min    Activity Tolerance  Patient limited by pain;Patient tolerated treatment well       Past Medical History:  Diagnosis Date  . Aortic valve sclerosis    No etenosis Echo 2012 Mild TR  . Arteriosclerotic coronary artery disease    nonobstructive/Mild AI -DR Radford Pax  . Atrial flutter, paroxysmal ([place]) 05/29/2015   CHADS2VASC score is 4 on chronic anticoaguation with DOAC  . BPH (benign prostatic hypertrophy)   . Degenerative disc disease    Dr Cyndy Freeze  . Diabetes mellitus without complication ([place])    Type II w/ microalbuminuria-Dr. Burnett Harry  . Dyslipidemia    elevated cholesterol-Dr Radford Pax  . Gout    Dr Justine Null  . 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 Hypertension   . Macular degeneration    mild hypertensive retinopaty,macular degeneration,referred to retina   . OA (osteoarthritis)    Dr Justine Null  . OSA (obstructive sleep apnea)    severe with AHI 43/hr now on BiPAP at 13/9cm H2O  . Screening for AAA (aortic abdominal aneurysm) 02/2012   No aneurysm  . TR (tricuspid regurgitation)    Mild  . Tubular adenoma of colon    colonoscopy 05/2011 repeat in 5 years -Dr. Watt Climes    Past Surgical History:  Procedure Laterality Date  . APPENDECTOMY    . BUNIONECTOMY    . CARDIAC CATHETERIZATION N/A 04/27/2015   Procedure: Left Heart Cath and Coronary Angiography;  Surgeon: Jettie Booze, MD;   Location: [facility];  Service: Cardiovascular;  Laterality: N/A;  . foot sx  2006  . HERNIA REPAIR  Age 36  . L-Disk L5-S1 (right)  1993    There were no vitals filed for this visit.  Subjective Assessment - 08/18/19 0930    Subjective  The Dn helped but it's still there.  The nurse called to tell me that MRI showed nerves being pinched. The traction helps my neck but not my arm.  Thinking about getting an over the door home traction unit if I can find it at the drugstore.     Currently in Pain?  Yes    Pain Score  6     Pain Location  Neck    Pain Orientation  Right                       [facility] Adult PT Treatment/Exercise - 08/18/19 0001      Moist Heat Therapy   Number Minutes Moist Heat  20 Minutes    Moist Heat Location  Cervical   prior to and concurrent with traction      Traction   Type of Traction  Cervical    Min (lbs)  10    Max (lbs)  22   25# for last 4 minutes;   3  folded washclothes; head strap   Hold Time  60    Rest Time  15    Time  15      Manual Therapy   Joint Mobilization  PA T1-4 30 sec each level  x2   Neural Stretch  wrist extension; elbow extension ; shoulder extension, scapular retraction 10x each                PT Short Term Goals - 08/16/19 1011      PT SHORT TERM GOAL #1   Title  ind with initial HEP    Time  3    Period  Weeks    Status  Achieved      PT SHORT TERM GOAL #2   Title  pt will report at least 30% less pain    Time  3    Period  Weeks    Status  On-going      PT SHORT TERM GOAL #3   Title  Pt will be able to eat and drink with Rt UE without increased pain    Period  Weeks    Status  On-going    Target Date  08/11/19        PT Long Term Goals - 07/21/19 1159      PT LONG TERM GOAL #1   Title  Pt will be ind with advanced HEP    Time  6    Period  Weeks    Status  New    Target Date  09/01/19      PT LONG TERM GOAL #2   Title  Pt will report </= to 41% limited    Baseline   65% limited    Time  6    Period  Weeks    Status  New    Target Date  09/01/19      PT LONG TERM GOAL #3   Title  Pt will demonstrate at least 55 deg Rt cervical rotation for improved ability to perform functional tasks such as driving    Time  6    Period  Weeks    Status  New    Target Date  09/01/19      PT LONG TERM GOAL #4   Title  Pt will demonstrate Rt shoulder flexion of at least 150 degrees for ability to reach things in shelf overhead    Time  6    Period  Weeks    Status  New    Target Date  09/01/19      PT LONG TERM GOAL #5   Title  Pt will report at least 75% less pain with functional daily tasks    Time  6    Period  Weeks    Status  New    Target Date  09/01/19            Plan - 08/18/19 1703    Clinical Impression Statement  The patient continues to complain of right UE symptoms on a constant basis.  Attempted seated (most comfortable position) cervical PA mobs at multiple levels with production of distal paresthesia.  Some paresthesia with neural gliding (one joint at a time secondary to symptom sensitivity).  With mechanical traction, multiple modifications needed ( pillow support behind right elbow, cervical support, padding behind head, forehead strap) for adequate distraction and comfort.  Suboccipital part of traction needed to be tightened quite a bit as well.  He requests increased mechanical pull and reports  decreased pain intensity after treatment session. Therapist closely monitoring response with all interventions.    PT Frequency  2x / week    PT Duration  6 weeks    PT Treatment/Interventions  ADLs/Self Care Home Management;Biofeedback;Cryotherapy;Electrical Stimulation;Iontophoresis 48m/ml Dexamethasone;Moist Heat;Traction;Therapeutic activities;Therapeutic exercise;Neuromuscular re-education;Patient/family education;Manual techniques;Taping;Dry needling;Passive range of motion    PT Next Visit Plan  continue with cervical traction; DN if  helpful;  neural tension stretch, soft tissue work    PT Home Exercise Plan  [id number]      Patient will benefit from skilled therapeutic intervention in order to improve the following deficits and impairments:  Impaired UE functional use, Pain, Decreased range of motion, Postural dysfunction, Increased muscle spasms, Decreased strength  Visit Diagnosis: Cervicalgia  Muscle weakness (generalized)  Acute pain of right shoulder  Other muscle spasm    PHYSICAL THERAPY DISCHARGE SUMMARY  Visits from Start of Care: 7 Current functional level related to goals / functional outcomes: The patient called to request discharge from PT, no reason given.     Remaining deficits: As above    Education / Equipment: Basic self care  Plan: Patient agrees to discharge.  Patient goals were not met. Patient is being discharged due to the patient's request.  ?????         Problem List Patient Active Problem List   Diagnosis Date Noted  . Permanent atrial fibrillation ([place] 05/29/2015  . Abnormal nuclear stress test   . OSA (obstructive sleep apnea) 04/10/2014  . Bradycardia, drug induced 04/10/2014  . Hypersomnia 10/20/2013  . Coronary atherosclerosis of native coronary artery 10/04/2013  . Hyperlipidemia 10/04/2013  . Benign essential HTN 10/04/2013   SRuben Im PT 08/18/19 5:12 PM Phone: [phone]: [phone] Singh11/09/2019, 5:11 PM  [facility] [address] [place] [address]Phone: [phone]  Fax:  [phone] Name: Austin BERNSTEINMRN: [id number] of Birth: [birth year]

## 2019-08-23 ENCOUNTER — Encounter: Payer: Medicare Other | Admitting: Physical Therapy

## 2019-08-25 ENCOUNTER — Telehealth: Payer: Self-pay | Admitting: *Deleted

## 2019-08-25 ENCOUNTER — Encounter: Payer: Medicare Other | Admitting: Physical Therapy

## 2019-08-25 NOTE — Telephone Encounter (Signed)
Have completed patient Austin Mcgrath pt assistance application for [employer]. Will fax forms after DOD signs provider part.

## 2019-08-29 NOTE — Telephone Encounter (Signed)
The pts [employer] pt astt application was faxed to [employer] on Friday 08/26/2019. We did receive a fax from [employer] stating that they have received the pts application.

## 2019-09-05 ENCOUNTER — Encounter (INDEPENDENT_AMBULATORY_CARE_PROVIDER_SITE_OTHER): Payer: Medicare Other | Admitting: Ophthalmology

## 2019-09-05 DIAGNOSIS — H35033 Hypertensive retinopathy, bilateral: Secondary | ICD-10-CM | POA: Diagnosis not present

## 2019-09-05 DIAGNOSIS — H353231 Exudative age-related macular degeneration, bilateral, with active choroidal neovascularization: Secondary | ICD-10-CM

## 2019-09-05 DIAGNOSIS — I10 Essential (primary) hypertension: Secondary | ICD-10-CM

## 2019-09-05 DIAGNOSIS — H43813 Vitreous degeneration, bilateral: Secondary | ICD-10-CM | POA: Diagnosis not present

## 2019-10-05 NOTE — Telephone Encounter (Signed)
Letter received via fax from [employer] stating that they have approved the pt for pt asst with his Eliquis. Approval good from 10/07/2019 until [id number] Application case# [id number]  The letter states that they have also notified the pt oh this approval.

## 2019-10-10 ENCOUNTER — Other Ambulatory Visit: Payer: Self-pay

## 2019-10-10 ENCOUNTER — Encounter (INDEPENDENT_AMBULATORY_CARE_PROVIDER_SITE_OTHER): Payer: Medicare Other | Admitting: Ophthalmology

## 2019-10-10 DIAGNOSIS — H353231 Exudative age-related macular degeneration, bilateral, with active choroidal neovascularization: Secondary | ICD-10-CM

## 2019-10-10 DIAGNOSIS — H43813 Vitreous degeneration, bilateral: Secondary | ICD-10-CM

## 2019-10-10 DIAGNOSIS — I10 Essential (primary) hypertension: Secondary | ICD-10-CM

## 2019-10-10 DIAGNOSIS — H35033 Hypertensive retinopathy, bilateral: Secondary | ICD-10-CM | POA: Diagnosis not present

## 2019-10-29 ENCOUNTER — Ambulatory Visit: Payer: Medicare Other

## 2019-10-29 ENCOUNTER — Ambulatory Visit: Payer: Medicare Other | Attending: Internal Medicine

## 2019-10-29 DIAGNOSIS — Z23 Encounter for immunization: Secondary | ICD-10-CM | POA: Insufficient documentation

## 2019-10-29 NOTE — Progress Notes (Signed)
   Covid-19 Vaccination Clinic  Name:  Austin Mcgrath    MRN: [id number] DOB: [birth year]  10/29/2019  [name] was observed post Covid-19 immunization for 15 minutes without incidence. He was provided with Vaccine Information Sheet and instruction to access the V-Safe system.   [name] was instructed to call 911 with any severe reactions post vaccine: Marland Kitchen Difficulty breathing  . Swelling of your face and throat  . A fast heartbeat  . A bad rash all over your body  . Dizziness and weakness    Immunizations Administered    Name Date Dose VIS Date Route   Pfizer COVID-19 Vaccine 10/29/2019  2:09 PM 0.3 mL 09/16/2019 Intramuscular   Manufacturer: [place]   Lot: [id number]   [place]: [id number]

## 2019-11-07 ENCOUNTER — Encounter (INDEPENDENT_AMBULATORY_CARE_PROVIDER_SITE_OTHER): Payer: Medicare Other | Admitting: Ophthalmology

## 2019-11-07 DIAGNOSIS — H2513 Age-related nuclear cataract, bilateral: Secondary | ICD-10-CM

## 2019-11-07 DIAGNOSIS — I10 Essential (primary) hypertension: Secondary | ICD-10-CM

## 2019-11-07 DIAGNOSIS — H353231 Exudative age-related macular degeneration, bilateral, with active choroidal neovascularization: Secondary | ICD-10-CM

## 2019-11-07 DIAGNOSIS — H35033 Hypertensive retinopathy, bilateral: Secondary | ICD-10-CM | POA: Diagnosis not present

## 2019-11-07 DIAGNOSIS — H43813 Vitreous degeneration, bilateral: Secondary | ICD-10-CM | POA: Diagnosis not present

## 2019-11-14 ENCOUNTER — Other Ambulatory Visit: Payer: Self-pay

## 2019-11-14 ENCOUNTER — Telehealth: Payer: Self-pay | Admitting: Cardiology

## 2019-11-14 MED ORDER — ATORVASTATIN CALCIUM 80 MG PO TABS: 80.0000 mg | ORAL_TABLET | Freq: Every day | ORAL | 0 refills | Status: DC

## 2019-11-14 MED ORDER — AMLODIPINE BESYLATE 10 MG PO TABS: 10.0000 mg | ORAL_TABLET | Freq: Every day | ORAL | 0 refills | Status: DC

## 2019-11-14 NOTE — Telephone Encounter (Signed)
We do not have any samples of this medication. Please address

## 2019-11-14 NOTE — Progress Notes (Signed)
That would work great - ok to call in amlodipine 10mg  daily and Atorvatstain 80mg  daily   Traci    Orders placed per Dr. Radford Pax.

## 2019-11-14 NOTE — Telephone Encounter (Signed)
   Patient calling the office for samples of medication:   1.  What medication and dosage are you requesting samples for? amLODipine-atorvastatin ([name]) 10-80 MG tablet  2.  Are you currently out of this medication? Yes  Patient says he gets this medication from an assistance program, and it will take 7-10 days for him to get his medication in the mail, possibly even longer. If the office does not have any samples, he will need an alternative medication to take while he waits for the medication   He has another appointment around 11:00  Today, but he has an answering machine

## 2019-11-14 NOTE — Telephone Encounter (Signed)
**Note De-Identified Austin Mcgrath Obfuscation** I have answered this call through [facility] message from 11/13/2019 as follows: Good Morning Austin Mcgrath, I just called [facility] [phone]) and ordered your Austin Mcgrath (Patient ID: [id number] and Order#: [id number]) I requested an urgent delivery but was advised by Mikle Bosworth with [employer] that they do not send medications urgently and that it will take 7 to 10 days for [name] to arrive at the office. Im so sorry but the office never gets [name] samples so unfortunately we cannot help out as I wish we could. You guys may need to purchase just enough to last him until [name] arrives from [employer] Patient Assistance program.  Thanks, Shona Needles advised that your Dad has 1 refill left on current application and will need to re-apply on 04/27/2020.

## 2019-11-19 ENCOUNTER — Ambulatory Visit: Payer: Medicare Other | Attending: Internal Medicine

## 2019-11-19 DIAGNOSIS — Z23 Encounter for immunization: Secondary | ICD-10-CM

## 2019-11-19 NOTE — Progress Notes (Signed)
   Covid-19 Vaccination Clinic  Name:  Austin Mcgrath    MRN: [id number] DOB: [birth year]  11/19/2019  Austin Mcgrath was observed post Covid-19 immunization for 15 minutes without incidence. He was provided with Vaccine Information Sheet and instruction to access the V-Safe system.   Austin Mcgrath was instructed to call 911 with any severe reactions post vaccine: Marland Kitchen Difficulty breathing  . Swelling of your face and throat  . A fast heartbeat  . A bad rash all over your body  . Dizziness and weakness    Immunizations Administered    Name Date Dose VIS Date Route   Pfizer COVID-19 Vaccine 11/19/2019  1:25 PM 0.3 mL 09/16/2019 Intramuscular   Manufacturer: [place]   Lot: [id number]   [place]: [id number]

## 2019-11-22 ENCOUNTER — Telehealth: Payer: Self-pay | Admitting: Cardiology

## 2019-11-22 NOTE — Telephone Encounter (Signed)
Called pt to inform him that his medication [name], 3 bottles of 30 pills, from [employer] pt assistance foundation, were at the office for him to pick up. Pt verbalized understanding.

## 2019-12-05 ENCOUNTER — Encounter (INDEPENDENT_AMBULATORY_CARE_PROVIDER_SITE_OTHER): Payer: Medicare Other | Admitting: Ophthalmology

## 2019-12-05 ENCOUNTER — Other Ambulatory Visit: Payer: Self-pay

## 2019-12-05 DIAGNOSIS — I10 Essential (primary) hypertension: Secondary | ICD-10-CM | POA: Diagnosis not present

## 2019-12-05 DIAGNOSIS — H35033 Hypertensive retinopathy, bilateral: Secondary | ICD-10-CM | POA: Diagnosis not present

## 2019-12-05 DIAGNOSIS — H43813 Vitreous degeneration, bilateral: Secondary | ICD-10-CM

## 2019-12-05 DIAGNOSIS — H353231 Exudative age-related macular degeneration, bilateral, with active choroidal neovascularization: Secondary | ICD-10-CM | POA: Diagnosis not present

## 2020-01-09 ENCOUNTER — Encounter (INDEPENDENT_AMBULATORY_CARE_PROVIDER_SITE_OTHER): Payer: Medicare Other | Admitting: Ophthalmology

## 2020-01-10 ENCOUNTER — Encounter (INDEPENDENT_AMBULATORY_CARE_PROVIDER_SITE_OTHER): Payer: Medicare Other | Admitting: Ophthalmology

## 2020-01-10 ENCOUNTER — Other Ambulatory Visit: Payer: Self-pay

## 2020-01-10 DIAGNOSIS — H43813 Vitreous degeneration, bilateral: Secondary | ICD-10-CM | POA: Diagnosis not present

## 2020-01-10 DIAGNOSIS — H35033 Hypertensive retinopathy, bilateral: Secondary | ICD-10-CM

## 2020-01-10 DIAGNOSIS — I10 Essential (primary) hypertension: Secondary | ICD-10-CM

## 2020-01-10 DIAGNOSIS — H353231 Exudative age-related macular degeneration, bilateral, with active choroidal neovascularization: Secondary | ICD-10-CM

## 2020-02-14 ENCOUNTER — Other Ambulatory Visit: Payer: Self-pay

## 2020-02-14 ENCOUNTER — Encounter (INDEPENDENT_AMBULATORY_CARE_PROVIDER_SITE_OTHER): Payer: Medicare Other | Admitting: Ophthalmology

## 2020-02-14 DIAGNOSIS — H43813 Vitreous degeneration, bilateral: Secondary | ICD-10-CM

## 2020-02-14 DIAGNOSIS — I10 Essential (primary) hypertension: Secondary | ICD-10-CM | POA: Diagnosis not present

## 2020-02-14 DIAGNOSIS — H35033 Hypertensive retinopathy, bilateral: Secondary | ICD-10-CM

## 2020-02-14 DIAGNOSIS — H353231 Exudative age-related macular degeneration, bilateral, with active choroidal neovascularization: Secondary | ICD-10-CM | POA: Diagnosis not present

## 2020-02-16 ENCOUNTER — Telehealth: Payer: Self-pay

## 2020-02-16 NOTE — Telephone Encounter (Signed)
Called pt and left a message informing him that his medication [name], 3 bottles of 30 pills, from [place] pt assistance foundation, were at the office for him to pick up and if he has any other problems, questions or concerns, to give our office a call.

## 2020-03-19 ENCOUNTER — Other Ambulatory Visit: Payer: Self-pay

## 2020-03-19 ENCOUNTER — Encounter (INDEPENDENT_AMBULATORY_CARE_PROVIDER_SITE_OTHER): Payer: Medicare Other | Admitting: Ophthalmology

## 2020-03-19 DIAGNOSIS — I10 Essential (primary) hypertension: Secondary | ICD-10-CM

## 2020-03-19 DIAGNOSIS — H353231 Exudative age-related macular degeneration, bilateral, with active choroidal neovascularization: Secondary | ICD-10-CM | POA: Diagnosis not present

## 2020-03-19 DIAGNOSIS — H35033 Hypertensive retinopathy, bilateral: Secondary | ICD-10-CM

## 2020-03-19 DIAGNOSIS — H43813 Vitreous degeneration, bilateral: Secondary | ICD-10-CM

## 2020-04-03 ENCOUNTER — Telehealth: Payer: Self-pay

## 2020-04-03 NOTE — Telephone Encounter (Signed)
**Note De-Identified Stacie Templin Obfuscation** The pt left his completed [employer] pt asst application for [name] at the office. I have completed the provider part of the application and e-mailed all to Dr Landis Gandy nurse so she can obtain her signature, date it and fax all to [employer] pt asst program at fax number written on cover letter.

## 2020-04-23 ENCOUNTER — Encounter (INDEPENDENT_AMBULATORY_CARE_PROVIDER_SITE_OTHER): Payer: Medicare Other | Admitting: Ophthalmology

## 2020-04-23 ENCOUNTER — Other Ambulatory Visit: Payer: Self-pay

## 2020-04-23 DIAGNOSIS — I10 Essential (primary) hypertension: Secondary | ICD-10-CM

## 2020-04-23 DIAGNOSIS — H43813 Vitreous degeneration, bilateral: Secondary | ICD-10-CM

## 2020-04-23 DIAGNOSIS — H35033 Hypertensive retinopathy, bilateral: Secondary | ICD-10-CM

## 2020-04-23 DIAGNOSIS — H353231 Exudative age-related macular degeneration, bilateral, with active choroidal neovascularization: Secondary | ICD-10-CM | POA: Diagnosis not present

## 2020-04-26 NOTE — Telephone Encounter (Signed)
**Note De-Identified Austin Mcgrath Obfuscation** Per a [facility] message from [place] the pts daughter I call [place] to check the status of the pts application and was advsied by Sherrie Sport that the pt has been approved for assistance with [name] with them from 04/28/2020 until 04/28/2021 and that their pharmacy will process his claim on 07/24 and will take 7 to 10 business days to arrive at his address. Pt ID: [id number]   I did advise Sharee Pimple of this through the [facility] message from 04/25/2020.

## 2020-05-07 ENCOUNTER — Telehealth: Payer: Self-pay

## 2020-05-07 NOTE — Telephone Encounter (Signed)
Called pt to inform him that his medication [name], 3 bottles of 30 pills, from [employer] pt assistance foundation, were at the office for him to pick up. Pt verbalized understanding. FYI

## 2020-05-28 ENCOUNTER — Other Ambulatory Visit: Payer: Self-pay

## 2020-05-28 ENCOUNTER — Encounter (INDEPENDENT_AMBULATORY_CARE_PROVIDER_SITE_OTHER): Payer: Medicare Other | Admitting: Ophthalmology

## 2020-05-28 DIAGNOSIS — H35033 Hypertensive retinopathy, bilateral: Secondary | ICD-10-CM | POA: Diagnosis not present

## 2020-05-28 DIAGNOSIS — H43813 Vitreous degeneration, bilateral: Secondary | ICD-10-CM | POA: Diagnosis not present

## 2020-05-28 DIAGNOSIS — I10 Essential (primary) hypertension: Secondary | ICD-10-CM

## 2020-05-28 DIAGNOSIS — H353231 Exudative age-related macular degeneration, bilateral, with active choroidal neovascularization: Secondary | ICD-10-CM | POA: Diagnosis not present

## 2020-06-29 ENCOUNTER — Encounter (INDEPENDENT_AMBULATORY_CARE_PROVIDER_SITE_OTHER): Payer: Medicare Other | Admitting: Ophthalmology

## 2020-06-29 ENCOUNTER — Other Ambulatory Visit: Payer: Self-pay

## 2020-06-29 DIAGNOSIS — H43813 Vitreous degeneration, bilateral: Secondary | ICD-10-CM | POA: Diagnosis not present

## 2020-06-29 DIAGNOSIS — I10 Essential (primary) hypertension: Secondary | ICD-10-CM

## 2020-06-29 DIAGNOSIS — H353231 Exudative age-related macular degeneration, bilateral, with active choroidal neovascularization: Secondary | ICD-10-CM | POA: Diagnosis not present

## 2020-06-29 DIAGNOSIS — H35033 Hypertensive retinopathy, bilateral: Secondary | ICD-10-CM

## 2020-07-02 ENCOUNTER — Encounter (INDEPENDENT_AMBULATORY_CARE_PROVIDER_SITE_OTHER): Payer: Medicare Other | Admitting: Ophthalmology

## 2020-07-09 ENCOUNTER — Ambulatory Visit: Payer: Medicare Other | Admitting: Cardiology

## 2020-07-19 ENCOUNTER — Ambulatory Visit: Payer: Medicare Other | Admitting: Cardiology

## 2020-08-03 ENCOUNTER — Encounter (INDEPENDENT_AMBULATORY_CARE_PROVIDER_SITE_OTHER): Payer: Medicare Other | Admitting: Ophthalmology

## 2020-08-03 ENCOUNTER — Other Ambulatory Visit: Payer: Self-pay

## 2020-08-03 DIAGNOSIS — H43813 Vitreous degeneration, bilateral: Secondary | ICD-10-CM

## 2020-08-03 DIAGNOSIS — H353231 Exudative age-related macular degeneration, bilateral, with active choroidal neovascularization: Secondary | ICD-10-CM | POA: Diagnosis not present

## 2020-08-03 DIAGNOSIS — I10 Essential (primary) hypertension: Secondary | ICD-10-CM

## 2020-08-03 DIAGNOSIS — H35033 Hypertensive retinopathy, bilateral: Secondary | ICD-10-CM

## 2020-08-17 ENCOUNTER — Telehealth: Payer: Self-pay | Admitting: Cardiology

## 2020-08-17 NOTE — Telephone Encounter (Signed)
Austin Mcgrath is calling in regards to [name] that he was previously speaking with Jeani Hawking in regards to. He wanting to see if it has arrived at the office yet. Please advise.

## 2020-08-17 NOTE — Telephone Encounter (Signed)
**Note De-Identified Dorann Davidson Obfuscation** The pt is advised that [name] shipment has been placed in the front office at Dr Landis Gandy office for him to pick up. He thanked me for all of our assistance and states that he will pick [name] up today.

## 2020-08-29 ENCOUNTER — Other Ambulatory Visit: Payer: Self-pay

## 2020-08-29 ENCOUNTER — Encounter: Payer: Self-pay | Admitting: Cardiology

## 2020-08-29 ENCOUNTER — Telehealth: Payer: Self-pay

## 2020-08-29 ENCOUNTER — Ambulatory Visit (INDEPENDENT_AMBULATORY_CARE_PROVIDER_SITE_OTHER): Payer: Medicare Other | Admitting: Cardiology

## 2020-08-29 VITALS — BP 136/68 | HR 61 | Ht 71.0 in | Wt 251.2 lb

## 2020-08-29 DIAGNOSIS — I10 Essential (primary) hypertension: Secondary | ICD-10-CM | POA: Diagnosis not present

## 2020-08-29 DIAGNOSIS — I4821 Permanent atrial fibrillation: Secondary | ICD-10-CM

## 2020-08-29 DIAGNOSIS — R0981 Nasal congestion: Secondary | ICD-10-CM

## 2020-08-29 DIAGNOSIS — I2510 Atherosclerotic heart disease of native coronary artery without angina pectoris: Secondary | ICD-10-CM | POA: Diagnosis not present

## 2020-08-29 DIAGNOSIS — R040 Epistaxis: Secondary | ICD-10-CM

## 2020-08-29 DIAGNOSIS — E7800 Pure hypercholesterolemia, unspecified: Secondary | ICD-10-CM | POA: Diagnosis not present

## 2020-08-29 DIAGNOSIS — G4733 Obstructive sleep apnea (adult) (pediatric): Secondary | ICD-10-CM

## 2020-08-29 LAB — CBC
Hematocrit: 41.2 % (ref 37.5–51.0)
Hemoglobin: 13.4 g/dL (ref 13.0–17.7)
MCH: 28.7 pg (ref 26.6–33.0)
MCHC: 32.5 g/dL (ref 31.5–35.7)
MCV: 88 fL (ref 79–97)
Platelets: 239 x10E3/uL (ref 150–450)
RBC: 4.67 x10E6/uL (ref 4.14–5.80)
RDW: 13.7 % (ref 11.6–15.4)
WBC: 8.3 x10E3/uL (ref 3.4–10.8)

## 2020-08-29 NOTE — Telephone Encounter (Addendum)
**Note De-Identified Nadalie Laughner Obfuscation** Per Carly Dr Landis Gandy nurse the pt left his completed BMSPAF application at the office and that all of the provider page has been completed, Dr Radford Pax signed it and Carly faxed all to [facility]. See [facility] message concerning this in the pts chart from 11/23 (yesterday).

## 2020-08-29 NOTE — Patient Instructions (Signed)
Medication Instructions:  Your physician recommends that you continue on your current medications as directed. Please refer to the Current Medication list given to you today.  *If you need a refill on your cardiac medications before your next appointment, please call your pharmacy*   Lab Work: TODAY: CBC If you have labs (blood work) drawn today and your tests are completely normal, you will receive your results only by: Marland Kitchen MyChart Message (if you have MyChart) OR . A paper copy in the mail If you have any lab test that is abnormal or we need to change your treatment, we will call you to review the results.  Follow-Up: At [facility], you and your health needs are our priority.  As part of our continuing mission to provide you with exceptional heart care, we have created designated Provider Care Teams.  These Care Teams include your primary Cardiologist (physician) and Advanced Practice Providers (APPs -  Physician Assistants and Nurse Practitioners) who all work together to provide you with the care you need, when you need it.   Your next appointment:   1 year(s)  The format for your next appointment:   In Person  Provider:   You may see Fransico Him, MD or one of the following Advanced Practice Providers on your designated Care Team:    Melina Copa, PA-C  Ermalinda Barrios, PA-C  Other Instructions You have been referred to ENT

## 2020-08-29 NOTE — Progress Notes (Signed)
Cardiology Office Note:    Date:  08/29/2020   ID:  Austin Mcgrath, DOB 08/04/1941, MRN [id number]  PCP:  Austin Amel, MD  Cardiologist:  Austin Him, MD    Referring MD: Austin Amel, MD   Chief Complaint  Patient presents with  . Coronary Artery Disease  . Hypertension  . Hyperlipidemia    History of Present Illness:    Austin Mcgrath is a 79 y.o. male with a hx of nonobstructive ASCADby cath 2016, HTN, OSA, paroxysmal atrial flutter on Eliquis and dyslipidemia.  He is here today for followup and is doing well.   He is here today for followup and is doing well.  He has chronic DOE which he thinks is stable but is having problems with his sinuses.  He says that his sinuses are dry and clogged up and has some problems with bleeding from his nose.  He denies any chest pain or pressure, PND, orthopnea, LE edema, dizziness, palpitations or syncope. He is compliant with his meds and is tolerating meds with no SE.    He is still using his PAP device but he says that there is a burning smell when he uses it. He would like to get a new machine as his is over 58 years old.  He tolerates the mask and feels the pressure is adequate.  He says that he feels tired in the am and will fall asleep if he is watching TV.  He goes to bed at 9-10pm and wakes up a lot at night to go to the bathroom.  He has been having problems with nasal congestion and bleeding in his nose.   Past Medical History:  Diagnosis Date  . Aortic valve sclerosis    No etenosis Echo 2012 Mild TR  . Arteriosclerotic coronary artery disease    nonobstructive/Mild AI -DR Austin Mcgrath  . Atrial flutter, paroxysmal ([place]) 05/29/2015   CHADS2VASC score is 4 on chronic anticoaguation with DOAC  . BPH (benign prostatic hypertrophy)   . Degenerative disc disease    Dr Austin Mcgrath  . Diabetes mellitus without complication (Austin Mcgrath)    Type II w/ microalbuminuria-Dr. Burnett Mcgrath  . Dyslipidemia    elevated cholesterol-Dr Austin Mcgrath  . Gout     Dr Austin Mcgrath  . 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 Hypertension   . Macular degeneration    mild hypertensive retinopaty,macular degeneration,referred to retina   . OA (osteoarthritis)    Dr Austin Mcgrath  . OSA (obstructive sleep apnea)    severe with AHI 43/hr now on BiPAP at 13/9cm H2O  . Screening for AAA (aortic abdominal aneurysm) 02/2012   No aneurysm  . TR (tricuspid regurgitation)    Mild  . Tubular adenoma of colon    colonoscopy 05/2011 repeat in 5 years -Dr. Watt Mcgrath    Past Surgical History:  Procedure Laterality Date  . APPENDECTOMY    . BUNIONECTOMY    . CARDIAC CATHETERIZATION N/A 04/27/2015   Procedure: Left Heart Cath and Coronary Angiography;  Surgeon: Austin Booze, MD;  Location: [facility];  Service: Cardiovascular;  Laterality: N/A;  . foot sx  2006  . HERNIA REPAIR  Age 45  . L-Disk L5-S1 (right)  1993    Current Medications: Current Meds  Medication Sig  . acetaminophen (TYLENOL) 325 MG tablet Take 650 mg by mouth every 6 (six) hours as needed.  Marland Kitchen allopurinol (ZYLOPRIM) 300 MG tablet Take 300 mg by mouth daily.  Marland Kitchen amLODipine-atorvastatin ([name]) 10-80 MG tablet  Take 1 tablet by mouth daily.  Marland Kitchen apixaban (ELIQUIS) 5 MG TABS tablet Take 1 tablet (5 mg total) by mouth 2 (two) times daily.  . benazepril (LOTENSIN) 20 MG tablet Take 20 mg by mouth daily.  Marland Kitchen glimepiride (AMARYL) 1 MG tablet TAKE 1 TABLET BY MOUTH WITH BREAKFAST OR THE FIRST MAIN MEAL OF THE DAY ONCE A DAY  . Multiple Vitamins-Minerals (ICAPS PO) Take 1 tablet by mouth daily.  . Omega 3 1200 MG CAPS Take 4 capsules by mouth daily.  Marland Kitchen spironolactone (ALDACTONE) 25 MG tablet Take 1 tablet (25 mg total) by mouth daily.     Allergies:   Contrast media [iodinated diagnostic agents]   Social History   Socioeconomic History  . Marital status: Married    Spouse name: Not on file  . Number of children: Not on file  . Years of education: Not on file  . Highest education level: Not on file  Occupational  History  . Not on file  Tobacco Use  . Smoking status: Former Smoker    Quit date: 10/07/1971    Years since quitting: 48.9  . Smokeless tobacco: Never Used  Vaping Use  . Vaping Use: Never used  Substance and Sexual Activity  . Alcohol use: No  . Drug use: No  . Sexual activity: Not on file  Other Topics Concern  . Not on file  Social History Narrative  . Not on file   Social Determinants of Health   Financial Resource Strain:   . Difficulty of Paying Living Expenses: Not on file  Food Insecurity:   . Worried About Charity fundraiser in the Last Year: Not on file  . Ran Out of Food in the Last Year: Not on file  Transportation Needs:   . Lack of Transportation (Medical): Not on file  . Lack of Transportation (Non-Medical): Not on file  Physical Activity:   . Days of Exercise per Week: Not on file  . Minutes of Exercise per Session: Not on file  Stress:   . Feeling of Stress : Not on file  Social Connections:   . Frequency of Communication with Friends and Family: Not on file  . Frequency of Social Gatherings with Friends and Family: Not on file  . Attends Religious Services: Not on file  . Active Member of Clubs or Organizations: Not on file  . Attends Archivist Meetings: Not on file  . Marital Status: Not on file     Family History: The patient's family history includes Breast cancer in his sister; Colon cancer in his sister; Hypertension in his brother and sister; Lung cancer in his mother. There is no history of Heart attack or Stroke.  ROS:   Please see the history of present illness.    ROS  All other systems reviewed and negative.   EKGs/Labs/Other Studies Reviewed:    The following studies were reviewed today: None  EKG:  EKG is ordered today and showed NSR with RBBB  Recent Labs: No results found for requested labs within last 8760 hours.   Recent Lipid Panel    Component Value Date/Time   CHOL 103 02/11/2018 0908   CHOL 111  10/28/2013 0813   TRIG 123 02/11/2018 0908   TRIG 146 10/28/2013 0813   HDL 35 (L) 02/11/2018 0908   HDL 37 (L) 10/28/2013 0813   CHOLHDL 2.9 02/11/2018 0908   CHOLHDL 2.4 06/23/2016 0845   VLDL 23 06/23/2016 0845   LDLCALC 43  02/11/2018 0908   LDLCALC 45 10/28/2013 0813    Physical Exam:    VS:  BP 136/68   Pulse 61   Ht 5\' 11"  (1.803 m)   Wt 251 lb 3.2 oz (113.9 kg)   SpO2 98%   BMI 35.04 kg/m     Wt Readings from Last 3 Encounters:  08/29/20 251 lb 3.2 oz (113.9 kg)  06/28/19 250 lb (113.4 kg)  12/01/18 249 lb (112.9 kg)     GEN: Well nourished, well developed in no acute distress HEENT: Normal NECK: No JVD; No carotid bruits LYMPHATICS: No lymphadenopathy CARDIAC:irregularly irregular, no murmurs, rubs, gallops RESPIRATORY:  Clear to auscultation without rales, wheezing or rhonchi  ABDOMEN: Soft, non-tender, non-distended MUSCULOSKELETAL:  trace edema; No deformity  SKIN: Warm and dry NEUROLOGIC:  Alert and oriented x 3 PSYCHIATRIC:  Normal affect    ASSESSMENT:    1. Atherosclerosis of native coronary artery of native heart without angina pectoris   2. Benign essential HTN   3. Permanent atrial fibrillation (HCC)   4. Pure hypercholesterolemia   5. OSA (obstructive sleep apnea)    PLAN:    In order of problems listed above:  1.  ASCAD  -nonobstructive by cath.  -he denies any anginal symptoms -no ASA due to DOAC -continue statin  2.  HTN  -BP controlled one exam today -continue Benazepril 20mg  daily, amlodipine 10mg  daily and spiro 25mg  daily -SCr controlled at 1 in Sept 2021  3.  Permanent atrial fibrillation  -maintaining NSR on exam -continue Eliquis 5mg  BID -SCr 1.02 in Sept 2021 -check CBC -refer to ENT since he is having problems with nasal congestion and bleeding from his nose  4.  Hyperlipidemia  -LDL goal is less than 70.  -LDL was 44 in Jan 2021 -continue Atorvastatin 80mg  daily  5.  OSA -  The patient is tolerating PAP  therapy well without any problems. The PAP download was reviewed today and showed an AHI of 2.6/hr on 13/9 cm H2O with 90% compliance in using more than 4 hours nightly.  The patient has been using and benefiting from PAP use and will continue to benefit from therapy.  -order new ResMed BiPAP on 13/9cm H2O with heated humidity and followup with me in 8 weeks after getting his device   Medication Adjustments/Labs and Tests Ordered: Current medicines are reviewed at length with the patient today.  Concerns regarding medicines are outlined above.  Orders Placed This Encounter  Procedures  . EKG 12-Lead   No orders of the defined types were placed in this encounter.   Signed, Austin Him, MD  08/29/2020 8:49 AM    [place]

## 2020-08-29 NOTE — Addendum Note (Signed)
Addended by: Antonieta Iba on: 08/29/2020 09:01 AM   Modules accepted: Orders

## 2020-09-04 ENCOUNTER — Telehealth: Payer: Self-pay | Admitting: *Deleted

## 2020-09-04 DIAGNOSIS — G4733 Obstructive sleep apnea (adult) (pediatric): Secondary | ICD-10-CM

## 2020-09-04 NOTE — Telephone Encounter (Signed)
-----   Message from Sueanne Margarita, MD sent at 08/29/2020  8:57 AM EST ----- order new ResMed BiPAP on 13/9cm H2O with heated humidity and followup with me in 8 weeks after getting his device

## 2020-09-04 NOTE — Telephone Encounter (Signed)
Order placed to [facility] via community message. 

## 2020-09-07 ENCOUNTER — Encounter (INDEPENDENT_AMBULATORY_CARE_PROVIDER_SITE_OTHER): Payer: Medicare Other | Admitting: Ophthalmology

## 2020-09-07 ENCOUNTER — Other Ambulatory Visit: Payer: Self-pay

## 2020-09-07 DIAGNOSIS — H43813 Vitreous degeneration, bilateral: Secondary | ICD-10-CM

## 2020-09-07 DIAGNOSIS — H353231 Exudative age-related macular degeneration, bilateral, with active choroidal neovascularization: Secondary | ICD-10-CM | POA: Diagnosis not present

## 2020-09-07 DIAGNOSIS — H35033 Hypertensive retinopathy, bilateral: Secondary | ICD-10-CM | POA: Diagnosis not present

## 2020-09-07 DIAGNOSIS — I10 Essential (primary) hypertension: Secondary | ICD-10-CM

## 2020-09-17 ENCOUNTER — Other Ambulatory Visit: Payer: Self-pay | Admitting: *Deleted

## 2020-09-17 DIAGNOSIS — I4892 Unspecified atrial flutter: Secondary | ICD-10-CM

## 2020-09-17 MED ORDER — APIXABAN 5 MG PO TABS: 5.0000 mg | ORAL_TABLET | Freq: Two times a day (BID) | ORAL | 2 refills | Status: DC

## 2020-09-17 NOTE — Telephone Encounter (Signed)
Eliquis 5mg  refill request received. Patient is 79 years old, weight-113.9kg, Crea-1.02 on 06/25/20 via [facility] from [place] PCP, Diagnosis-Atrial Flutter/Fib, and last seen by Dr. Radford Pax on 08/29/2020. Dose is appropriate based on dosing criteria. Will send in refill to requested pharmacy.

## 2020-10-10 ENCOUNTER — Ambulatory Visit (INDEPENDENT_AMBULATORY_CARE_PROVIDER_SITE_OTHER): Payer: Medicare Other | Admitting: Otolaryngology

## 2020-10-10 ENCOUNTER — Other Ambulatory Visit: Payer: Self-pay

## 2020-10-10 ENCOUNTER — Encounter (INDEPENDENT_AMBULATORY_CARE_PROVIDER_SITE_OTHER): Payer: Self-pay | Admitting: Otolaryngology

## 2020-10-10 VITALS — Temp 96.4°F

## 2020-10-10 DIAGNOSIS — R040 Epistaxis: Secondary | ICD-10-CM

## 2020-10-10 NOTE — Progress Notes (Signed)
HPI: Austin Mcgrath is a 80 y.o. male who presents is referred by Dr. Mayford Knife for evaluation of recurrent left-sided epistaxis that he has had for several months now.  He always bleeds from the left side and he is not sure what causes it.  He is on Eliquis.  Generally the bleeding will stop within a couple of minutes..  Past Medical History:  Diagnosis Date  . Aortic valve sclerosis    No etenosis Echo 2012 Mild TR  . Arteriosclerotic coronary artery disease    nonobstructive/Mild AI -DR Mayford Knife  . Atrial flutter, paroxysmal (HCC) 05/29/2015   CHADS2VASC score is 4 on chronic anticoaguation with DOAC  . BPH (benign prostatic hypertrophy)   . Degenerative disc disease    Dr Mikal Plane  . Diabetes mellitus without complication (HCC)    Type II w/ microalbuminuria-Dr. Georgina Pillion  . Dyslipidemia    elevated cholesterol-Dr Mayford Knife  . Gout    Dr Phylliss Bob  . 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 Hypertension   . Macular degeneration    mild hypertensive retinopaty,macular degeneration,referred to retina   . OA (osteoarthritis)    Dr Phylliss Bob  . OSA (obstructive sleep apnea)    severe with AHI 43/hr now on BiPAP at 13/9cm H2O  . Screening for AAA (aortic abdominal aneurysm) 02/2012   No aneurysm  . TR (tricuspid regurgitation)    Mild  . Tubular adenoma of colon    colonoscopy 05/2011 repeat in 5 years -Dr. Ewing Schlein   Past Surgical History:  Procedure Laterality Date  . APPENDECTOMY    . BUNIONECTOMY    . CARDIAC CATHETERIZATION N/A 04/27/2015   Procedure: Left Heart Cath and Coronary Angiography;  Surgeon: Corky Crafts, MD;  Location: [facility];  Service: Cardiovascular;  Laterality: N/A;  . foot sx  2006  . HERNIA REPAIR  Age 74  . L-Disk L5-S1 (right)  1993   Social History   Socioeconomic History  . Marital status: Married    Spouse name: Not on file  . Number of children: Not on file  . Years of education: Not on file  . Highest education level: Not on file  Occupational History  . Not  on file  Tobacco Use  . Smoking status: Former Smoker    Quit date: 10/07/1971    Years since quitting: 49.0  . Smokeless tobacco: Never Used  Vaping Use  . Vaping Use: Never used  Substance and Sexual Activity  . Alcohol use: No  . Drug use: No  . Sexual activity: Not on file  Other Topics Concern  . Not on file  Social History Narrative  . Not on file   Social Determinants of Health   Financial Resource Strain: Not on file  Food Insecurity: Not on file  Transportation Needs: Not on file  Physical Activity: Not on file  Stress: Not on file  Social Connections: Not on file   Family History  Problem Relation Age of Onset  . Lung cancer Mother   . Breast cancer Sister   . Colon cancer Sister   . Hypertension Sister   . Hypertension Brother   . Heart attack Neg Hx   . Stroke Neg Hx    Allergies  Allergen Reactions  . Contrast Media [Iodinated Diagnostic Agents]     Feet blistered   Prior to Admission medications   Medication Sig Start Date End Date Taking? Authorizing Provider  acetaminophen (TYLENOL) 325 MG tablet Take 650 mg by mouth every  6 (six) hours as needed.    [provider]  allopurinol (ZYLOPRIM) 300 MG tablet Take 300 mg by mouth daily.    [provider]  amLODipine-atorvastatin ([name]) 10-80 MG tablet Take 1 tablet by mouth daily. 04/26/18   Sueanne Margarita, MD  apixaban (ELIQUIS) 5 MG TABS tablet Take 1 tablet (5 mg total) by mouth 2 (two) times daily. 09/17/20   Sueanne Margarita, MD  benazepril (LOTENSIN) 20 MG tablet Take 20 mg by mouth daily.    [provider]  glimepiride (AMARYL) 1 MG tablet TAKE 1 TABLET BY MOUTH WITH BREAKFAST OR THE FIRST MAIN MEAL OF THE DAY ONCE A DAY 09/13/18   [provider]  Multiple Vitamins-Minerals (ICAPS PO) Take 1 tablet by mouth daily.    [provider]  Omega 3 1200 MG CAPS Take 4 capsules by mouth daily.    [provider]  spironolactone (ALDACTONE) 25 MG tablet  Take 1 tablet (25 mg total) by mouth daily. 12/24/16   Sueanne Margarita, MD     Positive ROS: Otherwise negative  All other systems have been reviewed and were otherwise negative with the exception of those mentioned in the HPI and as above.  Physical Exam: Constitutional: Alert, well-appearing, no acute distress Ears: External ears without lesions or tenderness. Ear canals are clear bilaterally with intact, clear TMs.  Nasal: External nose without lesions. Septum midline..  On intranasal exam nasal passages are clear.  He has a small anterior left septal vessel that appears to have been bleeding although is not bleeding presently.  He had no bleeding blowing his nose hard.  But he did have a small nodule on a vessel anteriorly on the left side and this was cauterized using silver nitrate.  Remaining nasal passageway was clear. Oral: Lips and gums without lesions. Tongue and palate mucosa without lesions. Posterior oropharynx clear. Neck: No palpable adenopathy or masses Respiratory: Breathing comfortably  Skin: No facial/neck lesions or rash noted.  Control of epistaxis  Date/Time: 10/10/2020 11:20 AM Performed by: Rozetta Nunnery, MD Authorized by: Rozetta Nunnery, MD   Consent:    Consent obtained:  Verbal   Consent given by:  Patient   Risks discussed:  Bleeding and pain   Alternatives discussed:  No treatment and observation Procedure details:    Treatment site:  L anterior   Treatment method:  Silver nitrate   Treatment complexity:  Limited Post-procedure details:    Assessment:  Bleeding stopped   Patient tolerance of procedure:  Tolerated well, no immediate complications Comments:     Patient had a small anterior left septal vessel cauterized in the office today using silver nitrate.    Assessment: Left-sided epistaxis  Plan: This was cauterized in the office today.  Reviewed with him concerning use of cotton balls and Afrin if he has any further nosebleeds  and will follow up as needed.   Radene Journey, MD   CC:

## 2020-10-12 ENCOUNTER — Encounter (INDEPENDENT_AMBULATORY_CARE_PROVIDER_SITE_OTHER): Payer: Medicare Other | Admitting: Ophthalmology

## 2020-10-12 ENCOUNTER — Other Ambulatory Visit: Payer: Self-pay

## 2020-10-12 DIAGNOSIS — I10 Essential (primary) hypertension: Secondary | ICD-10-CM | POA: Diagnosis not present

## 2020-10-12 DIAGNOSIS — H35033 Hypertensive retinopathy, bilateral: Secondary | ICD-10-CM

## 2020-10-12 DIAGNOSIS — H353231 Exudative age-related macular degeneration, bilateral, with active choroidal neovascularization: Secondary | ICD-10-CM | POA: Diagnosis not present

## 2020-10-12 DIAGNOSIS — H43813 Vitreous degeneration, bilateral: Secondary | ICD-10-CM | POA: Diagnosis not present

## 2020-10-30 NOTE — Telephone Encounter (Signed)
**Note De-Identified Austin Mcgrath Obfuscation** Letter received from [facility] stating that they have approved the pt for asst with his Eliquis. Approval is valid until 10/05/21 BMSPAF Case#: [id number]  The letter states that they have notified the pt of this approval as well.

## 2020-10-30 NOTE — Telephone Encounter (Signed)
Spoke with the patient who states that he has been having increased swelling in his feet. He reports that he does have some SOB with exertion. Denies chest pain. He has not been elevating his feet - advised him that he needs to be doing this throughout the day. He also reports that he eats salty foods such has chips, bacon, and sausage. Advised him that he needs to cut back on all of these foods and not to add any salt to his foods. Patient verbalized understanding. He will let us know if this does not help and I will try to find him an appointment asap.

## 2020-11-09 ENCOUNTER — Other Ambulatory Visit: Payer: Self-pay

## 2020-11-09 ENCOUNTER — Encounter (INDEPENDENT_AMBULATORY_CARE_PROVIDER_SITE_OTHER): Payer: Medicare Other | Admitting: Ophthalmology

## 2020-11-09 DIAGNOSIS — H353231 Exudative age-related macular degeneration, bilateral, with active choroidal neovascularization: Secondary | ICD-10-CM | POA: Diagnosis not present

## 2020-11-09 DIAGNOSIS — I10 Essential (primary) hypertension: Secondary | ICD-10-CM | POA: Diagnosis not present

## 2020-11-09 DIAGNOSIS — H35033 Hypertensive retinopathy, bilateral: Secondary | ICD-10-CM | POA: Diagnosis not present

## 2020-11-09 DIAGNOSIS — H43813 Vitreous degeneration, bilateral: Secondary | ICD-10-CM

## 2020-11-12 ENCOUNTER — Telehealth: Payer: Self-pay

## 2020-11-12 NOTE — Telephone Encounter (Signed)
Called pt to inform him that his medication [name] 10mg /80mg  tablets, 3 bottles of 30 tablets, were ready for him to pick up at the front desk at the office of Dr. Radford Pax. If you he has any other problems, questions or concerns, to give our office a call back. Pt verbalized understanding. Lot# [id number]  Exp# 01/2022

## 2020-12-07 ENCOUNTER — Other Ambulatory Visit: Payer: Self-pay

## 2020-12-07 ENCOUNTER — Encounter (INDEPENDENT_AMBULATORY_CARE_PROVIDER_SITE_OTHER): Payer: Medicare Other | Admitting: Ophthalmology

## 2020-12-07 DIAGNOSIS — H353231 Exudative age-related macular degeneration, bilateral, with active choroidal neovascularization: Secondary | ICD-10-CM | POA: Diagnosis not present

## 2020-12-07 DIAGNOSIS — H35033 Hypertensive retinopathy, bilateral: Secondary | ICD-10-CM

## 2020-12-07 DIAGNOSIS — I10 Essential (primary) hypertension: Secondary | ICD-10-CM | POA: Diagnosis not present

## 2020-12-07 DIAGNOSIS — H43813 Vitreous degeneration, bilateral: Secondary | ICD-10-CM | POA: Diagnosis not present

## 2021-01-04 ENCOUNTER — Encounter (INDEPENDENT_AMBULATORY_CARE_PROVIDER_SITE_OTHER): Payer: Medicare Other | Admitting: Ophthalmology

## 2021-01-04 ENCOUNTER — Other Ambulatory Visit: Payer: Self-pay

## 2021-01-04 DIAGNOSIS — H353231 Exudative age-related macular degeneration, bilateral, with active choroidal neovascularization: Secondary | ICD-10-CM | POA: Diagnosis not present

## 2021-01-04 DIAGNOSIS — H43813 Vitreous degeneration, bilateral: Secondary | ICD-10-CM

## 2021-01-04 DIAGNOSIS — I10 Essential (primary) hypertension: Secondary | ICD-10-CM

## 2021-01-04 DIAGNOSIS — H35033 Hypertensive retinopathy, bilateral: Secondary | ICD-10-CM

## 2021-02-04 ENCOUNTER — Other Ambulatory Visit: Payer: Self-pay

## 2021-02-04 ENCOUNTER — Encounter (INDEPENDENT_AMBULATORY_CARE_PROVIDER_SITE_OTHER): Payer: Medicare Other | Admitting: Ophthalmology

## 2021-02-04 DIAGNOSIS — H353231 Exudative age-related macular degeneration, bilateral, with active choroidal neovascularization: Secondary | ICD-10-CM

## 2021-02-04 DIAGNOSIS — H43813 Vitreous degeneration, bilateral: Secondary | ICD-10-CM

## 2021-02-04 DIAGNOSIS — I10 Essential (primary) hypertension: Secondary | ICD-10-CM

## 2021-02-04 DIAGNOSIS — H35033 Hypertensive retinopathy, bilateral: Secondary | ICD-10-CM

## 2021-02-05 ENCOUNTER — Telehealth: Payer: Self-pay

## 2021-02-05 NOTE — Telephone Encounter (Signed)
**Note De-Identified Austin Mcgrath Obfuscation** [employer] no longer offers a pt asst program at all and per letter received from them earlier this year I called [employer] at [phone] to order the pts last shipment of [name] and was advised that they do not handle [name] pt asst.  I called [facility] and s/w a supervisor Milus Banister who advised me that [employer] no longer owns [name] and that [employer] is the current manufacturer. Per Odetta Pink is not offering an asst program for [employer] as [employer] did.

## 2021-02-05 NOTE — Telephone Encounter (Signed)
**Note De-Identified Austin Mcgrath Obfuscation** I have sent the pt a message through [facility]. See Patient Message from 5/2 in the pts chart for details.

## 2021-02-08 NOTE — Telephone Encounter (Addendum)
Called pt to inform him that 3 bottles, of 30 tablets, of his medication amlodipine/atorvastatin ([name]) 10 mg/80 mg tablets, were at the [employer] office at the front desk for pt to pick up. Lot# [id number]   Exp: 01/2022  Pt verbalized understanding. FYI

## 2021-02-19 ENCOUNTER — Ambulatory Visit (INDEPENDENT_AMBULATORY_CARE_PROVIDER_SITE_OTHER): Payer: Medicare Other | Admitting: Cardiology

## 2021-02-19 ENCOUNTER — Other Ambulatory Visit: Payer: Self-pay

## 2021-02-19 ENCOUNTER — Encounter: Payer: Self-pay | Admitting: Cardiology

## 2021-02-19 VITALS — BP 130/58 | HR 58 | Ht 71.0 in | Wt 259.6 lb

## 2021-02-19 DIAGNOSIS — I10 Essential (primary) hypertension: Secondary | ICD-10-CM | POA: Diagnosis not present

## 2021-02-19 DIAGNOSIS — E7800 Pure hypercholesterolemia, unspecified: Secondary | ICD-10-CM | POA: Diagnosis not present

## 2021-02-19 DIAGNOSIS — G4733 Obstructive sleep apnea (adult) (pediatric): Secondary | ICD-10-CM

## 2021-02-19 DIAGNOSIS — I4892 Unspecified atrial flutter: Secondary | ICD-10-CM

## 2021-02-19 DIAGNOSIS — I4821 Permanent atrial fibrillation: Secondary | ICD-10-CM | POA: Diagnosis not present

## 2021-02-19 DIAGNOSIS — I2510 Atherosclerotic heart disease of native coronary artery without angina pectoris: Secondary | ICD-10-CM

## 2021-02-19 MED ORDER — BENAZEPRIL HCL 20 MG PO TABS: 20.0000 mg | ORAL_TABLET | Freq: Every day | ORAL | 3 refills | Status: DC

## 2021-02-19 MED ORDER — SPIRONOLACTONE 25 MG PO TABS: 25.0000 mg | ORAL_TABLET | Freq: Every day | ORAL | 3 refills | Status: DC

## 2021-02-19 MED ORDER — AMLODIPINE-ATORVASTATIN 10-80 MG PO TABS: 1.0000 | ORAL_TABLET | Freq: Every day | ORAL | 3 refills | Status: DC

## 2021-02-19 MED ORDER — ELIQUIS 5 MG PO TABS: 5.0000 mg | ORAL_TABLET | Freq: Two times a day (BID) | ORAL | 2 refills | Status: DC

## 2021-02-19 NOTE — Addendum Note (Signed)
Addended by: Antonieta Iba on: 02/19/2021 09:15 AM   Modules accepted: Orders

## 2021-02-19 NOTE — Progress Notes (Signed)
Cardiology Office Note:    Date:  02/19/2021   ID:  Austin Mcgrath, DOB October 05, 1941, MRN [id number]  PCP:  Lujean Amel, MD  Cardiologist:  Fransico Him, MD    Referring MD: Lujean Amel, MD   Chief Complaint  Patient presents with  . Coronary Artery Disease  . Hypertension  . Atrial Fibrillation  . Hyperlipidemia  . Sleep Apnea    History of Present Illness:    Austin Mcgrath is a 80 y.o. male with a hx of nonobstructive ASCADby cath 2016, HTN, OSA, paroxysmal atrial flutter on Eliquis and dyslipidemia.    He  is here today for followup of his CAD, LE edema, OSA, HTN, PAF and HLD and is doing well.  He denies any chest pain or pressure,  PND, orthopnea, dizziness (except when standing up too fast), palpitations or syncope.  He has chronic DOE and LE edema which he thinks are stable.  He is doing well with his CPAP device and thinks that he has gotten used to it.  He tolerates the mask and feels the pressure is adequate.  He goes to bed at 9-9:30pm and get up around 3-4am and gets up occasionally to urinate.  He does not feel rested in the am and naps some during the day occasionally.  He denies any significant mouth  or nasal congestion but has a lot of nose dryness.  He does not think that he snores.    Past Medical History:  Diagnosis Date  . Aortic valve sclerosis    No etenosis Echo 2012 Mild TR  . Arteriosclerotic coronary artery disease    nonobstructive/Mild AI -DR Radford Pax  . Atrial flutter, paroxysmal ([place]) 05/29/2015   CHADS2VASC score is 4 on chronic anticoaguation with DOAC  . BPH (benign prostatic hypertrophy)   . Degenerative disc disease    Dr Cyndy Freeze  . Diabetes mellitus without complication ([place])    Type II w/ microalbuminuria-Dr. Burnett Harry  . Dyslipidemia    elevated cholesterol-Dr Radford Pax  . Gout    Dr Justine Null  . 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 Hypertension   . Macular degeneration    mild hypertensive retinopaty,macular degeneration,referred to retina   .  OA (osteoarthritis)    Dr Justine Null  . OSA (obstructive sleep apnea)    severe with AHI 43/hr now on BiPAP at 13/9cm H2O  . Screening for AAA (aortic abdominal aneurysm) 02/2012   No aneurysm  . TR (tricuspid regurgitation)    Mild  . Tubular adenoma of colon    colonoscopy 05/2011 repeat in 5 years -Dr. Watt Climes    Past Surgical History:  Procedure Laterality Date  . APPENDECTOMY    . BUNIONECTOMY    . CARDIAC CATHETERIZATION N/A 04/27/2015   Procedure: Left Heart Cath and Coronary Angiography;  Surgeon: Jettie Booze, MD;  Location: [facility];  Service: Cardiovascular;  Laterality: N/A;  . foot sx  2006  . HERNIA REPAIR  Age 70  . L-Disk L5-S1 (right)  1993    Current Medications: Current Meds  Medication Sig  . acetaminophen (TYLENOL) 325 MG tablet Take 650 mg by mouth every 6 (six) hours as needed.  Marland Kitchen allopurinol (ZYLOPRIM) 300 MG tablet Take 300 mg by mouth daily.  Marland Kitchen amLODipine-atorvastatin ([name]) 10-80 MG tablet Take 1 tablet by mouth daily.  Marland Kitchen apixaban (ELIQUIS) 5 MG TABS tablet Take 1 tablet (5 mg total) by mouth 2 (two) times daily.  . benazepril (LOTENSIN) 20 MG tablet Take 20  mg by mouth daily.  . Cholecalciferol (VITAMIN D3) 25 MCG (1000 UT) CAPS Take 25 mcg by mouth daily.  . Cyanocobalamin (B-12 PO) Take 1 tablet by mouth daily.  Marland Kitchen glimepiride (AMARYL) 1 MG tablet TAKE 1 TABLET BY MOUTH WITH BREAKFAST OR THE FIRST MAIN MEAL OF THE DAY ONCE A DAY  . Multiple Vitamins-Minerals (ICAPS PO) Take 1 tablet by mouth daily.  . Omega 3 1200 MG CAPS Take 4 capsules by mouth daily.  Marland Kitchen spironolactone (ALDACTONE) 25 MG tablet Take 1 tablet (25 mg total) by mouth daily.     Allergies:   Contrast media [iodinated diagnostic agents] and Other   Social History   Socioeconomic History  . Marital status: Married    Spouse name: Not on file  . Number of children: Not on file  . Years of education: Not on file  . Highest education level: Not on file  Occupational History   . Not on file  Tobacco Use  . Smoking status: Former Smoker    Quit date: 10/07/1971    Years since quitting: 49.4  . Smokeless tobacco: Never Used  Vaping Use  . Vaping Use: Never used  Substance and Sexual Activity  . Alcohol use: No  . Drug use: No  . Sexual activity: Not on file  Other Topics Concern  . Not on file  Social History Narrative  . Not on file   Social Determinants of Health   Financial Resource Strain: Not on file  Food Insecurity: Not on file  Transportation Needs: Not on file  Physical Activity: Not on file  Stress: Not on file  Social Connections: Not on file     Family History: The patient's family history includes Breast cancer in his sister; Colon cancer in his sister; Hypertension in his brother and sister; Lung cancer in his mother. There is no history of Heart attack or Stroke.  ROS:   Please see the history of present illness.    ROS  All other systems reviewed and negative.   EKGs/Labs/Other Studies Reviewed:    The following studies were reviewed today: None  EKG:  EKG is ordered today and showed NSR with RBBB  Recent Labs: 08/29/2020: Hemoglobin 13.4; Platelets 239   Recent Lipid Panel    Component Value Date/Time   CHOL 103 02/11/2018 0908   CHOL 111 10/28/2013 0813   TRIG 123 02/11/2018 0908   TRIG 146 10/28/2013 0813   HDL 35 (L) 02/11/2018 0908   HDL 37 (L) 10/28/2013 0813   CHOLHDL 2.9 02/11/2018 0908   CHOLHDL 2.4 06/23/2016 0845   VLDL 23 06/23/2016 0845   LDLCALC 43 02/11/2018 0908   LDLCALC 45 10/28/2013 0813    Physical Exam:    VS:  BP (!) 130/58   Pulse (!) 58   Ht 5\' 11"  (1.803 m)   Wt 259 lb 9.6 oz (117.8 kg)   SpO2 97%   BMI 36.21 kg/m     Wt Readings from Last 3 Encounters:  02/19/21 259 lb 9.6 oz (117.8 kg)  08/29/20 251 lb 3.2 oz (113.9 kg)  06/28/19 250 lb (113.4 kg)     GEN: Well nourished, well developed in no acute distress HEENT: Normal NECK: No JVD; No carotid bruits LYMPHATICS: No  lymphadenopathy CARDIAC:RRR, no murmurs, rubs, gallops RESPIRATORY:  Clear to auscultation without rales, wheezing or rhonchi  ABDOMEN: Soft, non-tender, non-distended MUSCULOSKELETAL:  No edema; No deformity  SKIN: Warm and dry NEUROLOGIC:  Alert and oriented x 3  PSYCHIATRIC:  Normal affect    ASSESSMENT:    1. Atherosclerosis of native coronary artery of native heart without angina pectoris   2. Benign essential HTN   3. Permanent atrial fibrillation (HCC)   4. Pure hypercholesterolemia   5. OSA (obstructive sleep apnea)    PLAN:    In order of problems listed above:  1.  ASCAD  -nonobstructive by cath.  -he has not had any anginal sx since I saw him last -he is not on an ASA due to taking DOAC for PAF -continue medical management with statin  2.  HTN  -his BP is adequately controlled on exam today -will continue prescription drug management with Benazepril 20mg  daily, amlodipine 10mg  daily (on [name]) and spiro 25mg  daily>>refills sent in for 1 year -outside labs from PCP were personally reviewed and interpreted and showed a stable SCr at 1.02 and K+ 4.6  3.  Permanent atrial fibrillation  -he continues to maintain NSR on exam today and denies any palpitations -he denies any bleeding problems in the DOAC -continue prescription drug management with Eliquis 5mg  BID>>refills have been sent in for 1 year -SCr 1.02 and Hbg 13.4 on labs I personally reviewed and interpreted that were done by PCP  4.  Hyperlipidemia  -LDL goal is less than 70.  -I have reviewed his lipids from outside labs from PCP and interpreted them.  His LDL is at goal at 27 from Feb 2022 -continue prescription drug management with atorvastatin 80mg  daily (he is on [name])>>refills sent in for 1 year  5.  OSA - The patient is tolerating PAP therapy well without any problems. The PAP download was reviewed today and showed an AHI of 3.1/hr on 13/9 cm H2O with 90% compliance in using more than 4 hours  nightly.  The patient has been using and benefiting from PAP use and will continue to benefit from therapy.  -I encouraged him to use nasal saline spray 2 sprays each nostril BID -I also encouraged him to increase the humidity in his device to help with nasal dryness   Medication Adjustments/Labs and Tests Ordered: Current medicines are reviewed at length with the patient today.  Concerns regarding medicines are outlined above.  No orders of the defined types were placed in this encounter.  No orders of the defined types were placed in this encounter.   Signed, Fransico Him, MD  02/19/2021 9:02 AM    [place]

## 2021-02-19 NOTE — Patient Instructions (Signed)

## 2021-03-06 ENCOUNTER — Other Ambulatory Visit: Payer: Self-pay

## 2021-03-06 ENCOUNTER — Encounter (INDEPENDENT_AMBULATORY_CARE_PROVIDER_SITE_OTHER): Payer: Medicare Other | Admitting: Ophthalmology

## 2021-03-06 DIAGNOSIS — H43813 Vitreous degeneration, bilateral: Secondary | ICD-10-CM

## 2021-03-06 DIAGNOSIS — I10 Essential (primary) hypertension: Secondary | ICD-10-CM | POA: Diagnosis not present

## 2021-03-06 DIAGNOSIS — H353231 Exudative age-related macular degeneration, bilateral, with active choroidal neovascularization: Secondary | ICD-10-CM | POA: Diagnosis not present

## 2021-03-06 DIAGNOSIS — H35033 Hypertensive retinopathy, bilateral: Secondary | ICD-10-CM

## 2021-03-12 ENCOUNTER — Telehealth: Payer: Self-pay

## 2021-03-12 NOTE — Telephone Encounter (Signed)
**Note De-Identified [name] Obfuscation** We received an urgent request Austin Mcgrath fax from [facility], CPhT at [facility] asking [place] to complete the pts [employer] pt asst forms for [name] that she included in the fax, have Dr Radford Pax sign and date and to fax forms back to [place] at [phone]. I have completed the forms and emailed them to Dr Landis Gandy nurse so she can obtain her signature, date, and to fax back to [place] at the fax number written on the cover letter included or to place in the "to be faxed box" in Medical Records to be faxed.

## 2021-03-15 NOTE — Telephone Encounter (Signed)
Form has been signed by Dr. Radford Pax and has been placed in Medical records to be faxed.

## 2021-03-27 NOTE — Telephone Encounter (Signed)
**Note De-Identified [name] Obfuscation** Letter received from [employer] stating that they approved the pt for asst with his Eliquis until 10/05/2021. I have notified the pt of this approval [name] [facility] message. Pt [id number]

## 2021-04-03 ENCOUNTER — Other Ambulatory Visit: Payer: Self-pay

## 2021-04-03 ENCOUNTER — Encounter (INDEPENDENT_AMBULATORY_CARE_PROVIDER_SITE_OTHER): Payer: Medicare Other | Admitting: Ophthalmology

## 2021-04-03 DIAGNOSIS — H35033 Hypertensive retinopathy, bilateral: Secondary | ICD-10-CM

## 2021-04-03 DIAGNOSIS — H43813 Vitreous degeneration, bilateral: Secondary | ICD-10-CM

## 2021-04-03 DIAGNOSIS — H353231 Exudative age-related macular degeneration, bilateral, with active choroidal neovascularization: Secondary | ICD-10-CM

## 2021-04-03 DIAGNOSIS — I10 Essential (primary) hypertension: Secondary | ICD-10-CM | POA: Diagnosis not present

## 2021-04-15 ENCOUNTER — Other Ambulatory Visit: Payer: Self-pay | Admitting: *Deleted

## 2021-04-15 DIAGNOSIS — I4892 Unspecified atrial flutter: Secondary | ICD-10-CM

## 2021-04-15 MED ORDER — APIXABAN 5 MG PO TABS: 5.0000 mg | ORAL_TABLET | Freq: Two times a day (BID) | ORAL | 1 refills | Status: DC

## 2021-04-15 NOTE — Telephone Encounter (Signed)
Prescription refill request for Eliquis received. Indication: afib  Last office visit: [place], 02/19/2021 Scr: 1.02, 01/17/2021 Age: 80 yo  Weight: 117.8 kg   Pt is on the correct dose of Eliquis per dosing criteria, prescription refill sent for Eliquis 5mg  BID.

## 2021-05-01 ENCOUNTER — Other Ambulatory Visit: Payer: Self-pay

## 2021-05-01 ENCOUNTER — Encounter (INDEPENDENT_AMBULATORY_CARE_PROVIDER_SITE_OTHER): Payer: Medicare Other | Admitting: Ophthalmology

## 2021-05-01 DIAGNOSIS — I10 Essential (primary) hypertension: Secondary | ICD-10-CM | POA: Diagnosis not present

## 2021-05-01 DIAGNOSIS — H43813 Vitreous degeneration, bilateral: Secondary | ICD-10-CM | POA: Diagnosis not present

## 2021-05-01 DIAGNOSIS — H353231 Exudative age-related macular degeneration, bilateral, with active choroidal neovascularization: Secondary | ICD-10-CM | POA: Diagnosis not present

## 2021-05-01 DIAGNOSIS — H35033 Hypertensive retinopathy, bilateral: Secondary | ICD-10-CM

## 2021-05-07 ENCOUNTER — Telehealth: Payer: Self-pay

## 2021-05-07 NOTE — Telephone Encounter (Signed)
**Note De-Identified Riata Ikeda Obfuscation** Per the pts daughter Sharee Pimple [facility]) I called [employer] [phone]) and ordered the pts next shipment of [name] with Pam. Per Pam the pts [name] will arrive at the office within 10 business days. Pt ID: [id number] Order #[id number]  I have notified Sharee Pimple of this Christelle Igoe her [facility].

## 2021-05-17 NOTE — Telephone Encounter (Signed)
Pt is calling to check if his medicine has arrived at the office yet

## 2021-05-17 NOTE — Telephone Encounter (Signed)
Advised the patient that his medication has not arrived yet. Patient will call back Monday. The last dose he has is for Sunday night and then he will be out for Monday.

## 2021-05-20 NOTE — Telephone Encounter (Signed)
**Note De-Identified Audi Wettstein Obfuscation** I called [employer] and spoke with Melissa concerning the pts [name] shipment and she advised me that they spoke with Sharee Pimple (the pts daughter and DPR) on Friday (8/12) and advised her that there was a distributor delay in shipping out [name] and that they are shipping it out tomorrow (08/16).  I did request that they ship it today but per Melissa they cannot ship until tomorrow.  I have reached out to [place] through her/the pts [facility] message.

## 2021-05-22 ENCOUNTER — Other Ambulatory Visit: Payer: Self-pay

## 2021-05-22 ENCOUNTER — Telehealth: Payer: Self-pay | Admitting: *Deleted

## 2021-05-22 MED ORDER — AMLODIPINE BESYLATE 10 MG PO TABS: 10.0000 mg | ORAL_TABLET | Freq: Every day | ORAL | 0 refills | Status: DC

## 2021-05-22 MED ORDER — ATORVASTATIN CALCIUM 40 MG PO TABS: 80.0000 mg | ORAL_TABLET | Freq: Every day | ORAL | 0 refills | Status: DC

## 2021-05-22 NOTE — Telephone Encounter (Signed)
Patient called and stated that he is out of [name] 10 mg. He stated that he was told the mail order will be shipped on this past Tuesday. He called the "mail order company" and they told him it will be 7 to 10 days before receiving. He stated that "He has been out for 2 days and he can't wait 3 to 4 more days without medication." I asked him if he wants it sent to his pharmacy until mail order arrives,and he doesn't. I advised him that I will send a message to Dr. Theodosia Blender nurse. Please advise. The telephone number where he can be reached [phone].  Thank you

## 2021-05-22 NOTE — Telephone Encounter (Signed)
**Note De-Identified Austin Mcgrath Obfuscation** I called [employer] [facility] [employer] and was advised by Austin Mcgrath that they continue to have a [name] distributor delay and hope to have the pts [name] shipped tomorrow or Friday but cannot guarantee shipment date as they have not been provided one from the distributor.  Per the pts daughter Austin Mcgrath [facility]) request Austin Mcgrath [facility] message I have e-scribed the pt a Atorvastatin 40 mg (take 2 tablets daily) #30 and Amlodipine 10 mg #30 to [facility] on [address] to fill. I did send Austin Mcgrath a message back advising that the pts RXs was sent to [facility] to fill.

## 2021-05-29 ENCOUNTER — Other Ambulatory Visit: Payer: Self-pay

## 2021-05-29 ENCOUNTER — Encounter (INDEPENDENT_AMBULATORY_CARE_PROVIDER_SITE_OTHER): Payer: Medicare Other | Admitting: Ophthalmology

## 2021-05-29 DIAGNOSIS — H35033 Hypertensive retinopathy, bilateral: Secondary | ICD-10-CM | POA: Diagnosis not present

## 2021-05-29 DIAGNOSIS — H43813 Vitreous degeneration, bilateral: Secondary | ICD-10-CM

## 2021-05-29 DIAGNOSIS — H353231 Exudative age-related macular degeneration, bilateral, with active choroidal neovascularization: Secondary | ICD-10-CM | POA: Diagnosis not present

## 2021-05-29 DIAGNOSIS — I10 Essential (primary) hypertension: Secondary | ICD-10-CM

## 2021-06-04 NOTE — Telephone Encounter (Addendum)
Called pt to inform him that his medication from [employer] patient assistance program. 3 bottles of [name], 30 tablets per bottle, Exp: 01/2022  Lot# [id number]  I advised pt that if he has any other problems, questions or concerns, to give our office a call. Pt verbalized understanding. FYI

## 2021-06-26 ENCOUNTER — Encounter (INDEPENDENT_AMBULATORY_CARE_PROVIDER_SITE_OTHER): Payer: Medicare Other | Admitting: Ophthalmology

## 2021-06-28 ENCOUNTER — Other Ambulatory Visit: Payer: Self-pay

## 2021-06-28 ENCOUNTER — Encounter (INDEPENDENT_AMBULATORY_CARE_PROVIDER_SITE_OTHER): Payer: Medicare Other | Admitting: Ophthalmology

## 2021-06-28 DIAGNOSIS — I10 Essential (primary) hypertension: Secondary | ICD-10-CM | POA: Diagnosis not present

## 2021-06-28 DIAGNOSIS — H353231 Exudative age-related macular degeneration, bilateral, with active choroidal neovascularization: Secondary | ICD-10-CM

## 2021-06-28 DIAGNOSIS — H43813 Vitreous degeneration, bilateral: Secondary | ICD-10-CM | POA: Diagnosis not present

## 2021-06-28 DIAGNOSIS — H35033 Hypertensive retinopathy, bilateral: Secondary | ICD-10-CM

## 2021-07-29 ENCOUNTER — Encounter (INDEPENDENT_AMBULATORY_CARE_PROVIDER_SITE_OTHER): Payer: Medicare Other | Admitting: Ophthalmology

## 2021-07-30 ENCOUNTER — Encounter (INDEPENDENT_AMBULATORY_CARE_PROVIDER_SITE_OTHER): Payer: Medicare Other | Admitting: Ophthalmology

## 2021-07-30 ENCOUNTER — Other Ambulatory Visit: Payer: Self-pay

## 2021-07-30 DIAGNOSIS — I10 Essential (primary) hypertension: Secondary | ICD-10-CM

## 2021-07-30 DIAGNOSIS — H43813 Vitreous degeneration, bilateral: Secondary | ICD-10-CM | POA: Diagnosis not present

## 2021-07-30 DIAGNOSIS — H353231 Exudative age-related macular degeneration, bilateral, with active choroidal neovascularization: Secondary | ICD-10-CM | POA: Diagnosis not present

## 2021-07-30 DIAGNOSIS — H35033 Hypertensive retinopathy, bilateral: Secondary | ICD-10-CM

## 2021-08-14 ENCOUNTER — Telehealth: Payer: Self-pay

## 2021-08-14 NOTE — Telephone Encounter (Signed)
**Note De-Identified Gwynn Chalker Obfuscation** We received the provider page of a BMSPAF application for Eliquis from Susy Frizzle, CPhT with [employer] with a request to have Dr Radford Pax sign, date it, and to fax back to her at [phone].  I have completed the providers page and emailed all to Dr Theodosia Blender nurse so she can obtain her signature, date, and to fax to [place] at the fax number written on the cover letter included or to place in the to be faxed basket in Medical Records to be faxed.

## 2021-08-19 NOTE — Telephone Encounter (Signed)
Form placed in Medical Records to be faxed.

## 2021-08-27 ENCOUNTER — Encounter (INDEPENDENT_AMBULATORY_CARE_PROVIDER_SITE_OTHER): Payer: Medicare Other | Admitting: Ophthalmology

## 2021-08-27 ENCOUNTER — Other Ambulatory Visit: Payer: Self-pay

## 2021-08-27 DIAGNOSIS — H353231 Exudative age-related macular degeneration, bilateral, with active choroidal neovascularization: Secondary | ICD-10-CM | POA: Diagnosis not present

## 2021-08-27 DIAGNOSIS — H43813 Vitreous degeneration, bilateral: Secondary | ICD-10-CM | POA: Diagnosis not present

## 2021-08-27 DIAGNOSIS — H35033 Hypertensive retinopathy, bilateral: Secondary | ICD-10-CM | POA: Diagnosis not present

## 2021-08-27 DIAGNOSIS — I10 Essential (primary) hypertension: Secondary | ICD-10-CM

## 2021-09-03 ENCOUNTER — Other Ambulatory Visit: Payer: Self-pay | Admitting: Cardiology

## 2021-09-04 ENCOUNTER — Other Ambulatory Visit: Payer: Self-pay | Admitting: Cardiology

## 2021-09-04 ENCOUNTER — Encounter: Payer: Self-pay | Admitting: Cardiology

## 2021-09-04 MED ORDER — AMLODIPINE-ATORVASTATIN 10-80 MG PO TABS
1.0000 | ORAL_TABLET | Freq: Every day | ORAL | 1 refills | Status: DC
Start: 2021-09-04 — End: 2023-10-05

## 2021-09-04 MED ORDER — AMLODIPINE BESYLATE 10 MG PO TABS
10.0000 mg | ORAL_TABLET | Freq: Every day | ORAL | 0 refills | Status: DC
Start: 2021-09-04 — End: 2022-05-20

## 2021-09-04 MED ORDER — ATORVASTATIN CALCIUM 40 MG PO TABS: 80.0000 mg | ORAL_TABLET | Freq: Every day | ORAL | 0 refills | Status: DC

## 2021-09-25 ENCOUNTER — Encounter (INDEPENDENT_AMBULATORY_CARE_PROVIDER_SITE_OTHER): Payer: Medicare Other | Admitting: Ophthalmology

## 2021-09-25 ENCOUNTER — Other Ambulatory Visit: Payer: Self-pay

## 2021-09-25 DIAGNOSIS — H43813 Vitreous degeneration, bilateral: Secondary | ICD-10-CM

## 2021-09-25 DIAGNOSIS — H353231 Exudative age-related macular degeneration, bilateral, with active choroidal neovascularization: Secondary | ICD-10-CM

## 2021-09-25 DIAGNOSIS — I10 Essential (primary) hypertension: Secondary | ICD-10-CM | POA: Diagnosis not present

## 2021-09-25 DIAGNOSIS — H35033 Hypertensive retinopathy, bilateral: Secondary | ICD-10-CM

## 2021-10-07 ENCOUNTER — Other Ambulatory Visit: Payer: Self-pay | Admitting: Cardiology

## 2021-10-22 ENCOUNTER — Other Ambulatory Visit: Payer: Self-pay

## 2021-10-22 ENCOUNTER — Encounter (INDEPENDENT_AMBULATORY_CARE_PROVIDER_SITE_OTHER): Payer: Medicare Other | Admitting: Ophthalmology

## 2021-10-22 DIAGNOSIS — H35033 Hypertensive retinopathy, bilateral: Secondary | ICD-10-CM

## 2021-10-22 DIAGNOSIS — H43813 Vitreous degeneration, bilateral: Secondary | ICD-10-CM | POA: Diagnosis not present

## 2021-10-22 DIAGNOSIS — H2513 Age-related nuclear cataract, bilateral: Secondary | ICD-10-CM

## 2021-10-22 DIAGNOSIS — H353231 Exudative age-related macular degeneration, bilateral, with active choroidal neovascularization: Secondary | ICD-10-CM | POA: Diagnosis not present

## 2021-10-22 DIAGNOSIS — I10 Essential (primary) hypertension: Secondary | ICD-10-CM | POA: Diagnosis not present

## 2021-10-31 ENCOUNTER — Telehealth: Payer: Self-pay

## 2021-10-31 NOTE — Telephone Encounter (Signed)
**Note De-Identified Austin Mcgrath Obfuscation** We received a letter Tomothy Eddins fax from [employer] stating that they approved your the pts [name] for assistance until 10/29/2022. Pt ID: [id number]   The letter states that they have notified the pt of this approval and I have notified the pts daughter, Sharee Pimple [facility]) of this approval [place] [facility] [facility] message.

## 2021-11-01 NOTE — Telephone Encounter (Signed)
Called patient and let him know [name] 10/80 is here and will be up front for him to pick up.

## 2021-11-19 ENCOUNTER — Other Ambulatory Visit: Payer: Self-pay

## 2021-11-19 ENCOUNTER — Encounter (INDEPENDENT_AMBULATORY_CARE_PROVIDER_SITE_OTHER): Payer: Medicare Other | Admitting: Ophthalmology

## 2021-11-19 DIAGNOSIS — H43813 Vitreous degeneration, bilateral: Secondary | ICD-10-CM

## 2021-11-19 DIAGNOSIS — H353231 Exudative age-related macular degeneration, bilateral, with active choroidal neovascularization: Secondary | ICD-10-CM | POA: Diagnosis not present

## 2021-11-19 DIAGNOSIS — H35033 Hypertensive retinopathy, bilateral: Secondary | ICD-10-CM | POA: Diagnosis not present

## 2021-11-19 DIAGNOSIS — I10 Essential (primary) hypertension: Secondary | ICD-10-CM

## 2021-12-05 ENCOUNTER — Telehealth: Payer: Self-pay | Admitting: *Deleted

## 2021-12-05 NOTE — Telephone Encounter (Signed)
? ?  Pre-operative Risk Assessment  ?  ?Patient Name: Austin Mcgrath  ?DOB: 12/31/1940 ?MRN: [id number]  ? ?  ? ?Request for Surgical Clearance   ? ?Procedure:   COLONOSCOPY ? ?Date of Surgery:  Clearance 03/31/22                              ?   ?Surgeon:  DR. Watt Climes  ?Surgeon's Group or Practice Name:  [facility] ?Phone number:  [phone] ?Fax number:  [phone] ?  ?Type of Clearance Requested:   ?- Medical  ?- Pharmacy:  Hold Apixaban (Eliquis)   ?  ?Type of Anesthesia:   PROPOFOL  ?  ?Additional requests/questions:   ? ?Signed, ?Julaine Hua   ?12/05/2021, 3:59 PM  ? ?

## 2021-12-06 NOTE — Telephone Encounter (Addendum)
? ?  Primary Cardiologist: Fransico Him, MD ? ?Chart reviewed as part of pre-operative protocol coverage. Given past medical history and time since last visit, based on ACC/AHA guidelines, SERAJ DUNNAM would be at acceptable risk for the planned procedure without further cardiovascular testing.  ? ?Mr. Kujawa was contacted today via phone.  He is doing well from a cardiovascular standpoint.  He has had no chest pain and his atrial fibrillation is well controlled with no symptoms.  He meets the minimum METS requirement for cardiac clearance.  Last seen in the office May 2022. ? ?Per pharmacy she is okay to hold his Eliquis 1 to 2 days prior to his colonoscopy.  He is to restart his Eliquis as soon as it is medically safe to do so. ? ?Patient was advised that if he develops new symptoms prior to surgery to contact our office to arrange a follow-up appointment.  He verbalized understanding. ? ?I will route this recommendation to the requesting party via Epic fax function and remove from pre-op pool. ? ?Please call with questions. ? ?Elgie Collard, PA-C ? ?12/06/2021, 9:28 AM ? ?  ?

## 2021-12-06 NOTE — Telephone Encounter (Signed)
Patient with diagnosis of afib on Eliquis for anticoagulation.   ? ?Procedure: colonoscopy ?Date of procedure: 03/31/22 ? ?CHA2DS2-VASc Score = 5  ?This indicates a 7.2% annual risk of stroke. ?The patient's score is based upon: ?CHF History: 0 ?HTN History: 1 ?Diabetes History: 1 ?Stroke History: 0 ?Vascular Disease History: 1 ?Age Score: 2 ?Gender Score: 0 ? ?CrCl 44mL/min using adjusted body weight due to obesity ?Platelet count 211K ? ?Per office protocol, patient can hold Eliquis for 1-2 days prior to procedure.   ?

## 2021-12-17 ENCOUNTER — Encounter (INDEPENDENT_AMBULATORY_CARE_PROVIDER_SITE_OTHER): Payer: Medicare Other | Admitting: Ophthalmology

## 2021-12-17 ENCOUNTER — Other Ambulatory Visit: Payer: Self-pay

## 2021-12-17 DIAGNOSIS — I10 Essential (primary) hypertension: Secondary | ICD-10-CM

## 2021-12-17 DIAGNOSIS — H35033 Hypertensive retinopathy, bilateral: Secondary | ICD-10-CM

## 2021-12-17 DIAGNOSIS — H43813 Vitreous degeneration, bilateral: Secondary | ICD-10-CM | POA: Diagnosis not present

## 2021-12-17 DIAGNOSIS — H353231 Exudative age-related macular degeneration, bilateral, with active choroidal neovascularization: Secondary | ICD-10-CM

## 2022-01-21 ENCOUNTER — Encounter (INDEPENDENT_AMBULATORY_CARE_PROVIDER_SITE_OTHER): Payer: Medicare Other | Admitting: Ophthalmology

## 2022-01-21 DIAGNOSIS — H35033 Hypertensive retinopathy, bilateral: Secondary | ICD-10-CM

## 2022-01-21 DIAGNOSIS — I10 Essential (primary) hypertension: Secondary | ICD-10-CM | POA: Diagnosis not present

## 2022-01-21 DIAGNOSIS — H353231 Exudative age-related macular degeneration, bilateral, with active choroidal neovascularization: Secondary | ICD-10-CM

## 2022-01-21 DIAGNOSIS — H43813 Vitreous degeneration, bilateral: Secondary | ICD-10-CM

## 2022-01-21 DIAGNOSIS — H2513 Age-related nuclear cataract, bilateral: Secondary | ICD-10-CM

## 2022-02-03 ENCOUNTER — Telehealth: Payer: Self-pay

## 2022-02-03 NOTE — Telephone Encounter (Signed)
Called pt and spoke with pt's wife informing them that pt's medication from [employer] Patient Assistance program was at the front desk at [facility], for pt to pick up. 3 bottles of [name] 10/80 mg tablets, 30 tablets per bottle. Wife verbalized understanding.  Lot: [id number]  Exp: 08/2023 ?

## 2022-02-18 ENCOUNTER — Encounter (INDEPENDENT_AMBULATORY_CARE_PROVIDER_SITE_OTHER): Payer: Medicare Other | Admitting: Ophthalmology

## 2022-02-18 DIAGNOSIS — Z79899 Other long term (current) drug therapy: Secondary | ICD-10-CM | POA: Diagnosis not present

## 2022-02-18 DIAGNOSIS — H35033 Hypertensive retinopathy, bilateral: Secondary | ICD-10-CM

## 2022-02-18 DIAGNOSIS — H353231 Exudative age-related macular degeneration, bilateral, with active choroidal neovascularization: Secondary | ICD-10-CM | POA: Diagnosis not present

## 2022-02-18 DIAGNOSIS — I10 Essential (primary) hypertension: Secondary | ICD-10-CM

## 2022-02-18 DIAGNOSIS — H43813 Vitreous degeneration, bilateral: Secondary | ICD-10-CM | POA: Diagnosis not present

## 2022-02-18 DIAGNOSIS — M069 Rheumatoid arthritis, unspecified: Secondary | ICD-10-CM | POA: Diagnosis not present

## 2022-03-18 ENCOUNTER — Encounter (INDEPENDENT_AMBULATORY_CARE_PROVIDER_SITE_OTHER): Payer: Medicare Other | Admitting: Ophthalmology

## 2022-03-18 DIAGNOSIS — H353231 Exudative age-related macular degeneration, bilateral, with active choroidal neovascularization: Secondary | ICD-10-CM

## 2022-03-18 DIAGNOSIS — I10 Essential (primary) hypertension: Secondary | ICD-10-CM | POA: Diagnosis not present

## 2022-03-18 DIAGNOSIS — H35033 Hypertensive retinopathy, bilateral: Secondary | ICD-10-CM

## 2022-03-18 DIAGNOSIS — H43813 Vitreous degeneration, bilateral: Secondary | ICD-10-CM

## 2022-04-15 ENCOUNTER — Encounter (INDEPENDENT_AMBULATORY_CARE_PROVIDER_SITE_OTHER): Payer: Medicare Other | Admitting: Ophthalmology

## 2022-04-15 DIAGNOSIS — H35033 Hypertensive retinopathy, bilateral: Secondary | ICD-10-CM | POA: Diagnosis not present

## 2022-04-15 DIAGNOSIS — I10 Essential (primary) hypertension: Secondary | ICD-10-CM | POA: Diagnosis not present

## 2022-04-15 DIAGNOSIS — H353231 Exudative age-related macular degeneration, bilateral, with active choroidal neovascularization: Secondary | ICD-10-CM

## 2022-04-15 DIAGNOSIS — H43813 Vitreous degeneration, bilateral: Secondary | ICD-10-CM | POA: Diagnosis not present

## 2022-04-22 ENCOUNTER — Other Ambulatory Visit: Payer: Self-pay | Admitting: Cardiology

## 2022-05-13 ENCOUNTER — Telehealth: Payer: Self-pay

## 2022-05-13 ENCOUNTER — Encounter (INDEPENDENT_AMBULATORY_CARE_PROVIDER_SITE_OTHER): Payer: Medicare Other | Admitting: Ophthalmology

## 2022-05-13 DIAGNOSIS — I10 Essential (primary) hypertension: Secondary | ICD-10-CM | POA: Diagnosis not present

## 2022-05-13 DIAGNOSIS — H43813 Vitreous degeneration, bilateral: Secondary | ICD-10-CM | POA: Diagnosis not present

## 2022-05-13 DIAGNOSIS — H353231 Exudative age-related macular degeneration, bilateral, with active choroidal neovascularization: Secondary | ICD-10-CM

## 2022-05-13 DIAGNOSIS — H35033 Hypertensive retinopathy, bilateral: Secondary | ICD-10-CM | POA: Diagnosis not present

## 2022-05-13 NOTE — Telephone Encounter (Signed)
Called pt and spoke with pt's wife Vickii Chafe, to inform them that pt's medication [name] 10 mg/80 mg tablets, from [employer] patient assistance program, were at [employer] [employer] for pt to pick up at the front desk. Pt's wife verbalized understanding.  Lot: [id number]   Exp: 08/2023

## 2022-05-20 ENCOUNTER — Ambulatory Visit (INDEPENDENT_AMBULATORY_CARE_PROVIDER_SITE_OTHER): Payer: Medicare Other | Admitting: Cardiology

## 2022-05-20 ENCOUNTER — Encounter: Payer: Self-pay | Admitting: Cardiology

## 2022-05-20 VITALS — BP 110/72 | HR 69 | Ht 71.0 in | Wt 258.4 lb

## 2022-05-20 DIAGNOSIS — G4733 Obstructive sleep apnea (adult) (pediatric): Secondary | ICD-10-CM

## 2022-05-20 DIAGNOSIS — I2510 Atherosclerotic heart disease of native coronary artery without angina pectoris: Secondary | ICD-10-CM | POA: Diagnosis not present

## 2022-05-20 DIAGNOSIS — I4821 Permanent atrial fibrillation: Secondary | ICD-10-CM

## 2022-05-20 DIAGNOSIS — E7800 Pure hypercholesterolemia, unspecified: Secondary | ICD-10-CM

## 2022-05-20 DIAGNOSIS — I10 Essential (primary) hypertension: Secondary | ICD-10-CM

## 2022-05-20 NOTE — Progress Notes (Signed)
Cardiology Office Note:    Date:  05/20/2022   ID:  Austin Mcgrath, DOB [birth year], MRN [id number]  PCP:  Lujean Amel, MD  Cardiologist:  Fransico Him, MD    Referring MD: Lujean Amel, MD   Chief Complaint  Patient presents with   Coronary Artery Disease   Hypertension   Hyperlipidemia   Atrial Fibrillation   Sleep Apnea    History of Present Illness:    Austin Mcgrath is a 81 y.o. 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     is here today for followup and is doing well.  He has says for the past year he has had tightness in his chest.  It is atypical in that it is constantly there and never goes away but is worse when he exerts himself.  He has a lot of mucous in the am but the tightness continues despite the mucous resolving.  The pain is more on the right than left and is not associated with nausea or diaphoresis.  He also has had DOE that he says he cannot even walk to his mailbox without getting very SOB. He has been having intermittent LE edema as well. He denies any PND, orthopnea, dizziness, palpitations or syncope. He is compliant with his meds and is tolerating meds with no SE.     He is doing well with his CPAP device and thinks that he has gotten used to it.  He tolerates the mask and feels the pressure is adequate.  Since going on CPAP he feels rested in the am and has no significant daytime sleepiness.  He denies any significant mouth or nasal dryness or nasal congestion.  He does not think that he snores.     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 Watt Climes    Past Surgical History:  Procedure Laterality Date   APPENDECTOMY     [place] N/A 04/27/2015   Procedure: Left Heart Cath and Coronary Angiography;  Surgeon: Jettie Booze, MD;  Location: [facility];  Service: Cardiovascular;  Laterality: N/A;   foot sx  2006   HERNIA REPAIR  Age 50   L-Disk L5-S1 (right)  1993    Current Medications: Current Meds  Medication Sig   acetaminophen (TYLENOL) 325 MG tablet Take 650 mg by mouth  every 6 (six) hours as needed.   allopurinol (ZYLOPRIM) 300 MG tablet Take 300 mg by mouth daily.   amLODipine-atorvastatin ([name]) 10-80 MG tablet Take 1 tablet by mouth daily.   apixaban (ELIQUIS) 5 MG TABS tablet Take 1 tablet (5 mg total) by mouth 2 (two) times daily.   atorvastatin (LIPITOR) 40 MG tablet Take 2 tablets by mouth once daily   benazepril (LOTENSIN) 20 MG tablet Take 1 tablet by mouth once daily   Besifloxacin HCl (BESIVANCE) 0.6 % SUSP 4 days after going to eye dr   Cholecalciferol (VITAMIN D3) 25 MCG (1000 UT) CAPS Take 25 mcg by mouth daily.   Cyanocobalamin (B-12 PO) Take 1 tablet by mouth daily.   glimepiride (AMARYL) 1 MG tablet TAKE 1 TABLET BY MOUTH WITH BREAKFAST OR THE FIRST MAIN MEAL OF THE DAY ONCE A DAY   glimepiride (AMARYL) 1 MG tablet Take 1 mg by mouth daily.   hydroxychloroquine (PLAQUENIL) 200  MG tablet Take 200 mg by mouth daily.   Multiple Vitamins-Minerals (ICAPS PO) Take 1 tablet by mouth daily.   Omega 3 1200 MG CAPS Take 4 capsules by mouth daily.   spironolactone (ALDACTONE) 25 MG tablet Take 1 tablet by mouth once daily     Allergies:   Contrast media [iodinated contrast media] and Other   Social History   Socioeconomic History   Marital status: Married    Spouse name: Not on file   Number of children: Not on file   Years of education: Not on file   Highest education level: Not on file  Occupational History   Not on file  Tobacco Use   Smoking status: Former    Types: Cigarettes    Quit date: 10/07/1971    Years since quitting: 50.6   Smokeless tobacco: Never  Vaping Use   Vaping Use: Never used  Substance and Sexual Activity   Alcohol use: No   Drug use: No   Sexual activity: Not on file  Other Topics Concern   Not on file  Social History Narrative   Not on file   Social Determinants of Health   Financial Resource Strain: Not on file  Food Insecurity: Not on file  Transportation Needs: Not on file  Physical Activity: Not on file  Stress: Not on file  Social Connections: Not on file     Family History: The patient's family history includes Breast cancer in his sister; Colon cancer in his sister; Hypertension in his brother and sister; Lung cancer in his mother. There is no history of Heart attack or Stroke.  ROS:   Please see the history of present illness.    ROS  All other systems reviewed and negative.   EKGs/Labs/Other Studies Reviewed:    The following studies were reviewed today:  EKG:  EKG ordered today demonstrates atrial fibrillation with RBBB  Recent Labs: No results found for requested labs within last 365 days.   Recent Lipid Panel    Component Value Date/Time   CHOL 103 02/11/2018 0908   CHOL 111 10/28/2013 0813   TRIG 123 02/11/2018 0908   TRIG 146 10/28/2013 0813   HDL 35 (L) 02/11/2018 0908   HDL 37 (L) 10/28/2013  0813   CHOLHDL 2.9 02/11/2018 0908   CHOLHDL 2.4 06/23/2016 0845   VLDL 23 06/23/2016 0845   LDLCALC 43 02/11/2018 0908   LDLCALC 45 10/28/2013 0813    Physical Exam:    VS:  BP 110/72   Pulse 69  Ht '5\' 11"'$  (1.803 m)   Wt 258 lb 6.4 oz (117.2 kg)   SpO2 96%   BMI 36.04 kg/m     Wt Readings from Last 3 Encounters:  05/20/22 258 lb 6.4 oz (117.2 kg)  02/19/21 259 lb 9.6 oz (117.8 kg)  08/29/20 251 lb 3.2 oz (113.9 kg)    GEN: Well nourished, well developed in no acute distress HEENT: Normal NECK: No JVD; No carotid bruits LYMPHATICS: No lymphadenopathy CARDIAC:irregularly irregular, no murmurs, rubs, gallops RESPIRATORY:  Clear to auscultation without rales, wheezing or rhonchi  ABDOMEN: Soft, non-tender, non-distended MUSCULOSKELETAL:  No edema; No deformity  SKIN: Warm and dry NEUROLOGIC:  Alert and oriented x 3 PSYCHIATRIC:  Normal affect   ASSESSMENT:    1. Atherosclerosis of native coronary artery of native heart without angina pectoris   2. Benign essential HTN   3. Permanent atrial fibrillation (HCC)   4. Pure hypercholesterolemia   5. OSA (obstructive sleep apnea)    PLAN:    In order of problems listed above:  1.  ASCAD  -nonobstructive by cath.  He has been having chest tightness and DOE for the past year. The CP is atypical in that it is always there and never goes away.  The SOB occurs with minimal exertion.  -I will get a Lexiscan myoview to rule out ischemia -Shared Decision Making/Informed Consent The risks [chest pain, shortness of breath, cardiac arrhythmias, dizziness, blood pressure fluctuations, myocardial infarction, stroke/transient ischemic attack, nausea, vomiting, allergic reaction, radiation exposure, metallic taste sensation and life-threatening complications (estimated to be 1 in 10,000)], benefits (risk stratification, diagnosing coronary artery disease, treatment guidance) and alternatives of a nuclear stress test were discussed in  detail with Mr. Staley and he agrees to proceed. -check 2D echo to assess LVF -he is not on an ASA due to taking DOAC for PAF -continue medical management with statin -if stress test and echo are normal then will refer to Pulmonary   2.  HTN  -BP adequately controlled on exam today -Continue drug management with amlodipine/atorvastatin 10-80 mg daily, benazepril 20 mg daily, spironolactone 25 mg daily with as needed refills -outside labs from PCP were personally reviewed and interpreted and showed a stable serum creatinine 1.16 and potassium 4.9 in May 2023 and February 2023   3.  Permanent atrial fibrillation  -Heart rate is well controlled and he denies any palpitations -he denies any bleeding problems in the DOAC -Continue prescription drug management with Eliquis 5 mg twice daily with as needed refills -Hemoglobin was 15.3 on 02/21/2022   4.  Hyperlipidemia  -LDL goal is less than 70.  -I have reviewed his lipids from outside labs from PCP and interpreted them.  His LDL is at goal at 40 on November 28, 2021 -Continue prescription drug management with atorvastatin 80 mg daily with as needed refills  5.  OSA - The patient is tolerating PAP therapy well without any problems. The PAP download performed by his DME was personally reviewed and interpreted by me today and showed an AHI of 1.8 /hr on BiPAP 13/9 cm H2O with 100 % compliance in using more than 4 hours nightly.  The patient has been using and benefiting from PAP use and will continue to benefit from therapy.     Medication Adjustments/Labs and Tests Ordered: Current medicines are reviewed at length with the patient today.  Concerns regarding medicines are outlined above.  Orders Placed This Encounter  Procedures   EKG 12-Lead   No  orders of the defined types were placed in this encounter.   Signed, Fransico Him, MD  05/20/2022 8:24 AM    [place]

## 2022-05-20 NOTE — Addendum Note (Signed)
Addended by: Sueanne Margarita on: 05/20/2022 08:45 AM   Modules accepted: Orders

## 2022-05-20 NOTE — Addendum Note (Signed)
Addended by: Antonieta Iba on: 05/20/2022 08:36 AM   Modules accepted: Orders

## 2022-05-20 NOTE — Patient Instructions (Signed)
Medication Instructions:  Your physician recommends that you continue on your current medications as directed. Please refer to the Current Medication list given to you today.  *If you need a refill on your cardiac medications before your next appointment, please call your pharmacy*  Testing/Procedures: Your physician has requested that you have an echocardiogram. Echocardiography is a painless test that uses sound waves to create images of your heart. It provides your doctor with information about the size and shape of your heart and how well your heart's chambers and valves are working. This procedure takes approximately one hour. There are no restrictions for this procedure.  Your physician has requested that you have a lexiscan myoview. For further information please visit [email or web address]. Please follow instruction sheet, as given.  Follow-Up: At [facility], you and your health needs are our priority.  As part of our continuing mission to provide you with exceptional heart care, we have created designated Provider Care Teams.  These Care Teams include your primary Cardiologist (physician) and Advanced Practice Providers (APPs -  Physician Assistants and Nurse Practitioners) who all work together to provide you with the care you need, when you need it.  Your next appointment:   1 year(s)  The format for your next appointment:   In Person  Provider:   Fransico Him, MD    Important Information About Sugar

## 2022-05-26 ENCOUNTER — Telehealth (HOSPITAL_COMMUNITY): Payer: Self-pay | Admitting: *Deleted

## 2022-05-26 ENCOUNTER — Other Ambulatory Visit: Payer: Self-pay | Admitting: Cardiology

## 2022-05-26 NOTE — Telephone Encounter (Signed)
Patient's wife given detailed instructions per Myocardial Perfusion Study Information Sheet for the test on 06/02/2022 at 7:15. Patient notified to arrive 15 minutes early and that it is imperative to arrive on time for appointment to keep from having the test rescheduled.  If you need to cancel or reschedule your appointment, please call the office within 24 hours of your appointment. . Patient's wife verbalized understanding.[name]

## 2022-05-28 ENCOUNTER — Other Ambulatory Visit: Payer: Self-pay

## 2022-05-28 NOTE — Patient Outreach (Signed)
  Care Coordination   Initial Visit Note   05/28/2022 Name: Austin Mcgrath MRN: [id number] DOB: 1941/09/02  Austin Mcgrath is a 81 y.o. year old male who sees Koirala, Dibas, MD for primary care. I spoke with  Austin Mcgrath by phone today  What matters to the patients health and wellness today?  I have been doing good and my blood sugars are good    Goals Addressed             This Visit's Progress    COMPLETED: Care Coordination Activities - no follow up required       Care Coordination Interventions: Provided education to patient re: Annual Wellness Visit, care coordination services Reviewed medications with patient and discussed adherence Assessed social determinant of health barriers Annual Wellness Visit completed 11/28/21 per records, Pt reports diabetes controlled with last A1C 6.9%          SDOH assessments and interventions completed:  Yes  SDOH Interventions Today    Flowsheet Row Most Recent Value  SDOH Interventions   Food Insecurity Interventions Intervention Not Indicated  Housing Interventions Intervention Not Indicated  Transportation Interventions Intervention Not Indicated        Care Coordination Interventions Activated:  Yes  Care Coordination Interventions:  Yes, provided   Follow up plan: No further intervention required.   Encounter Outcome:  Pt. Visit Completed  Austin Garter RN, BSN,CCM, CDE Care Management Coordinator [facility] [phone]

## 2022-05-28 NOTE — Patient Instructions (Signed)
Visit Information  Thank you for taking time to visit with me today. Please don't hesitate to contact me if I can be of assistance to you.   Following are the goals we discussed today:   Goals Addressed             This Visit's Progress    COMPLETED: Care Coordination Activities - no follow up required       Care Coordination Interventions: Provided education to patient re: Annual Wellness Visit, care coordination services Reviewed medications with patient and discussed adherence Assessed social determinant of health barriers Annual Wellness Visit completed 11/28/21 per records, Pt reports diabetes controlled with last A1C 6.9%          If you are experiencing a Mental Health or [place] or need someone to talk to, please call the Suicide and Crisis Lifeline: 988 call the [place] National Suicide Prevention Lifeline: 8122646547 or TTY: 7872056835 TTY (773)400-0569) to talk to a trained counselor call 1-800-273-TALK (toll free, 24 hour hotline) go to [facility] [address] [place] [phone]) call 911   Patient verbalizes understanding of instructions and care plan provided today and agrees to view in [place]. Active MyChart status and patient understanding of how to access instructions and care plan via MyChart confirmed with patient.     No further follow up required:    Peter Garter RN, Jackquline Denmark, [facility] [phone]

## 2022-06-02 ENCOUNTER — Ambulatory Visit (HOSPITAL_COMMUNITY): Payer: Medicare Other | Attending: Cardiology

## 2022-06-02 ENCOUNTER — Ambulatory Visit (HOSPITAL_BASED_OUTPATIENT_CLINIC_OR_DEPARTMENT_OTHER): Payer: Medicare Other

## 2022-06-02 ENCOUNTER — Encounter: Payer: Self-pay | Admitting: Cardiology

## 2022-06-02 DIAGNOSIS — I10 Essential (primary) hypertension: Secondary | ICD-10-CM

## 2022-06-02 DIAGNOSIS — E7800 Pure hypercholesterolemia, unspecified: Secondary | ICD-10-CM

## 2022-06-02 DIAGNOSIS — I2510 Atherosclerotic heart disease of native coronary artery without angina pectoris: Secondary | ICD-10-CM

## 2022-06-02 DIAGNOSIS — G4733 Obstructive sleep apnea (adult) (pediatric): Secondary | ICD-10-CM | POA: Diagnosis present

## 2022-06-02 DIAGNOSIS — I77810 Thoracic aortic ectasia: Secondary | ICD-10-CM | POA: Insufficient documentation

## 2022-06-02 DIAGNOSIS — I4821 Permanent atrial fibrillation: Secondary | ICD-10-CM | POA: Diagnosis present

## 2022-06-02 LAB — ECHOCARDIOGRAM COMPLETE
Area-P 1/2: 4.57 cm2
Height: 71 in
P 1/2 time: 518 msec
S' Lateral: 3.9 cm
Weight: 4128 oz

## 2022-06-02 LAB — MYOCARDIAL PERFUSION IMAGING
LV dias vol: 116 mL (ref 62–150)
LV sys vol: 45 mL
Nuc Stress EF: 61 %
Peak HR: 88 {beats}/min
Rest HR: 74 {beats}/min
Rest Nuclear Isotope Dose: 10.6 mCi
SDS: 2
SRS: 0
SSS: 2
ST Depression (mm): 0 mm
Stress Nuclear Isotope Dose: 32.7 mCi
TID: 1.03

## 2022-06-02 MED ORDER — TECHNETIUM TC 99M TETROFOSMIN IV KIT
10.6000 | PACK | Freq: Once | INTRAVENOUS | Status: AC | PRN
  Administered 2022-06-02: 10.6 via INTRAVENOUS

## 2022-06-02 MED ORDER — REGADENOSON 0.4 MG/5ML IV SOLN
0.4000 mg | Freq: Once | INTRAVENOUS | Status: AC
  Administered 2022-06-02: 0.4 mg via INTRAVENOUS

## 2022-06-02 MED ORDER — TECHNETIUM TC 99M TETROFOSMIN IV KIT
32.7000 | PACK | Freq: Once | INTRAVENOUS | Status: AC | PRN
  Administered 2022-06-02: 32.7 via INTRAVENOUS

## 2022-06-10 ENCOUNTER — Encounter (INDEPENDENT_AMBULATORY_CARE_PROVIDER_SITE_OTHER): Payer: Medicare Other | Admitting: Ophthalmology

## 2022-06-10 DIAGNOSIS — H353231 Exudative age-related macular degeneration, bilateral, with active choroidal neovascularization: Secondary | ICD-10-CM

## 2022-06-10 DIAGNOSIS — I10 Essential (primary) hypertension: Secondary | ICD-10-CM | POA: Diagnosis not present

## 2022-06-10 DIAGNOSIS — H43813 Vitreous degeneration, bilateral: Secondary | ICD-10-CM

## 2022-06-10 DIAGNOSIS — H35033 Hypertensive retinopathy, bilateral: Secondary | ICD-10-CM

## 2022-07-07 ENCOUNTER — Encounter (INDEPENDENT_AMBULATORY_CARE_PROVIDER_SITE_OTHER): Payer: Medicare Other | Admitting: Ophthalmology

## 2022-07-07 DIAGNOSIS — H35033 Hypertensive retinopathy, bilateral: Secondary | ICD-10-CM

## 2022-07-07 DIAGNOSIS — H43813 Vitreous degeneration, bilateral: Secondary | ICD-10-CM

## 2022-07-07 DIAGNOSIS — I10 Essential (primary) hypertension: Secondary | ICD-10-CM

## 2022-07-07 DIAGNOSIS — H353231 Exudative age-related macular degeneration, bilateral, with active choroidal neovascularization: Secondary | ICD-10-CM

## 2022-08-04 ENCOUNTER — Encounter (INDEPENDENT_AMBULATORY_CARE_PROVIDER_SITE_OTHER): Payer: Medicare Other | Admitting: Ophthalmology

## 2022-08-04 DIAGNOSIS — H353231 Exudative age-related macular degeneration, bilateral, with active choroidal neovascularization: Secondary | ICD-10-CM

## 2022-08-04 DIAGNOSIS — H35033 Hypertensive retinopathy, bilateral: Secondary | ICD-10-CM

## 2022-08-04 DIAGNOSIS — I10 Essential (primary) hypertension: Secondary | ICD-10-CM

## 2022-08-04 DIAGNOSIS — H43813 Vitreous degeneration, bilateral: Secondary | ICD-10-CM | POA: Diagnosis not present

## 2022-08-25 ENCOUNTER — Telehealth: Payer: Self-pay

## 2022-08-25 NOTE — Telephone Encounter (Signed)
Called pt and spoke with pt's wife Vickii Chafe, to inform them that pt's medication [name] 10 mg/80 mg tablets, from [employer] patient assistance program, were at [employer] the [employer] for pt to pick up at the front desk. Pt's wife verbalized understanding.  Lot: [id number]   Exp: 08/2023 ETK

## 2022-09-01 ENCOUNTER — Encounter (INDEPENDENT_AMBULATORY_CARE_PROVIDER_SITE_OTHER): Payer: Medicare Other | Admitting: Ophthalmology

## 2022-09-01 DIAGNOSIS — H353231 Exudative age-related macular degeneration, bilateral, with active choroidal neovascularization: Secondary | ICD-10-CM

## 2022-09-01 DIAGNOSIS — H35033 Hypertensive retinopathy, bilateral: Secondary | ICD-10-CM | POA: Diagnosis not present

## 2022-09-01 DIAGNOSIS — I10 Essential (primary) hypertension: Secondary | ICD-10-CM | POA: Diagnosis not present

## 2022-09-01 DIAGNOSIS — H43813 Vitreous degeneration, bilateral: Secondary | ICD-10-CM

## 2022-09-09 ENCOUNTER — Ambulatory Visit
Admission: RE | Admit: 2022-09-09 | Discharge: 2022-09-09 | Disposition: A | Discharge status: Home / Self Care | Payer: Medicare Other | Source: Ambulatory Visit | Attending: Family Medicine | Admitting: Family Medicine

## 2022-09-09 ENCOUNTER — Other Ambulatory Visit: Payer: Self-pay | Admitting: Family Medicine

## 2022-09-09 DIAGNOSIS — R052 Subacute cough: Secondary | ICD-10-CM

## 2022-09-17 ENCOUNTER — Other Ambulatory Visit: Payer: Self-pay | Admitting: *Deleted

## 2022-09-17 DIAGNOSIS — I4892 Unspecified atrial flutter: Secondary | ICD-10-CM

## 2022-09-17 MED ORDER — APIXABAN 5 MG PO TABS: 5.0000 mg | ORAL_TABLET | Freq: Two times a day (BID) | ORAL | 1 refills | Status: DC

## 2022-09-17 NOTE — Telephone Encounter (Signed)
Eliquis '5mg'$  refill request received. Patient is 81 years old, weight-117kg, Crea-1.16 on 08/19/2022, Diagnosis-Afib, and last seen by Dr. Radford Pax on 05/20/2022. Dose is appropriate based on dosing criteria. Will send in refill to requested pharmacy.

## 2022-09-22 ENCOUNTER — Encounter (HOSPITAL_BASED_OUTPATIENT_CLINIC_OR_DEPARTMENT_OTHER): Payer: Self-pay | Admitting: Cardiology

## 2022-10-01 ENCOUNTER — Telehealth: Payer: Self-pay

## 2022-10-01 NOTE — Telephone Encounter (Signed)
Patient assistance for [facility] faxed to [place]  patient assistance foundation. Fax confirmation received 09/26/22.

## 2022-10-02 ENCOUNTER — Telehealth: Payer: Self-pay

## 2022-10-02 NOTE — Telephone Encounter (Signed)
**Note De-Identified Austin Mcgrath Obfuscation** Letter received from [employer] Pt [employer] (Phone: [phone]) stating that the pt has been approved for [name] assistance until 10/31/2023. Patient ID: [id number]  The letter states that they have notified the pt of this approval as well.

## 2022-10-07 ENCOUNTER — Encounter (INDEPENDENT_AMBULATORY_CARE_PROVIDER_SITE_OTHER): Payer: Medicare Other | Admitting: Ophthalmology

## 2022-10-07 DIAGNOSIS — I10 Essential (primary) hypertension: Secondary | ICD-10-CM | POA: Diagnosis not present

## 2022-10-07 DIAGNOSIS — H353231 Exudative age-related macular degeneration, bilateral, with active choroidal neovascularization: Secondary | ICD-10-CM | POA: Diagnosis not present

## 2022-10-07 DIAGNOSIS — H35033 Hypertensive retinopathy, bilateral: Secondary | ICD-10-CM

## 2022-10-07 DIAGNOSIS — H43813 Vitreous degeneration, bilateral: Secondary | ICD-10-CM | POA: Diagnosis not present

## 2022-10-10 NOTE — Telephone Encounter (Signed)
**Note De-Identified Rethel Sebek Obfuscation** Letter received from [facility] stating that they have approved the pt for Eliquis assistance until 10/06/2023. Case#: [id number]  The letter states that they have notified the pt of this approval as well.

## 2022-11-10 ENCOUNTER — Encounter (INDEPENDENT_AMBULATORY_CARE_PROVIDER_SITE_OTHER): Payer: Medicare Other | Admitting: Ophthalmology

## 2022-11-10 DIAGNOSIS — I10 Essential (primary) hypertension: Secondary | ICD-10-CM

## 2022-11-10 DIAGNOSIS — H353231 Exudative age-related macular degeneration, bilateral, with active choroidal neovascularization: Secondary | ICD-10-CM

## 2022-11-10 DIAGNOSIS — H43813 Vitreous degeneration, bilateral: Secondary | ICD-10-CM

## 2022-11-10 DIAGNOSIS — H35033 Hypertensive retinopathy, bilateral: Secondary | ICD-10-CM | POA: Diagnosis not present

## 2022-12-12 ENCOUNTER — Telehealth: Payer: Self-pay

## 2022-12-12 NOTE — Telephone Encounter (Signed)
Called pt and spoke with pt's wife Vickii Chafe to inform them that the pt's medication [name] 10 mg/80 mg tablets, 3 bottles of 30 tablets, from [employer] patient assistance program, was at [employer] street front desk for pt to pick up. Pt's wife verbalized understanding.   Lot#  [id number]   Exp:  08/2023    FYI

## 2022-12-22 ENCOUNTER — Encounter (INDEPENDENT_AMBULATORY_CARE_PROVIDER_SITE_OTHER): Payer: Medicare Other | Admitting: Ophthalmology

## 2022-12-22 DIAGNOSIS — H35033 Hypertensive retinopathy, bilateral: Secondary | ICD-10-CM

## 2022-12-22 DIAGNOSIS — H43813 Vitreous degeneration, bilateral: Secondary | ICD-10-CM

## 2022-12-22 DIAGNOSIS — H353231 Exudative age-related macular degeneration, bilateral, with active choroidal neovascularization: Secondary | ICD-10-CM

## 2022-12-22 DIAGNOSIS — I10 Essential (primary) hypertension: Secondary | ICD-10-CM

## 2023-02-02 ENCOUNTER — Encounter (INDEPENDENT_AMBULATORY_CARE_PROVIDER_SITE_OTHER): Payer: Medicare Other | Admitting: Ophthalmology

## 2023-02-02 DIAGNOSIS — I10 Essential (primary) hypertension: Secondary | ICD-10-CM

## 2023-02-02 DIAGNOSIS — H35033 Hypertensive retinopathy, bilateral: Secondary | ICD-10-CM | POA: Diagnosis not present

## 2023-02-02 DIAGNOSIS — H353231 Exudative age-related macular degeneration, bilateral, with active choroidal neovascularization: Secondary | ICD-10-CM | POA: Diagnosis not present

## 2023-02-02 DIAGNOSIS — H43813 Vitreous degeneration, bilateral: Secondary | ICD-10-CM | POA: Diagnosis not present

## 2023-03-16 ENCOUNTER — Encounter (INDEPENDENT_AMBULATORY_CARE_PROVIDER_SITE_OTHER): Payer: Medicare Other | Admitting: Ophthalmology

## 2023-03-16 ENCOUNTER — Other Ambulatory Visit: Payer: Self-pay | Admitting: *Deleted

## 2023-03-16 DIAGNOSIS — H353231 Exudative age-related macular degeneration, bilateral, with active choroidal neovascularization: Secondary | ICD-10-CM | POA: Diagnosis not present

## 2023-03-16 DIAGNOSIS — I10 Essential (primary) hypertension: Secondary | ICD-10-CM

## 2023-03-16 DIAGNOSIS — I4892 Unspecified atrial flutter: Secondary | ICD-10-CM

## 2023-03-16 DIAGNOSIS — H35033 Hypertensive retinopathy, bilateral: Secondary | ICD-10-CM | POA: Diagnosis not present

## 2023-03-16 DIAGNOSIS — H43813 Vitreous degeneration, bilateral: Secondary | ICD-10-CM | POA: Diagnosis not present

## 2023-03-16 MED ORDER — APIXABAN 5 MG PO TABS: 5.0000 mg | ORAL_TABLET | Freq: Two times a day (BID) | ORAL | 1 refills | Status: DC

## 2023-03-16 NOTE — Telephone Encounter (Signed)
Eliquis 5mg  refill request received. 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 Dose is appropriate based on dosing criteria. Will send in refill to requested pharmacy.

## 2023-03-25 ENCOUNTER — Telehealth: Payer: Self-pay

## 2023-03-25 NOTE — Telephone Encounter (Signed)
Called pt and spoke with pt's wife Gigi Gin, informing them that pt's medication [name] 10/80 mg  tablets, were at [facility] office, at the front desk for pt to pick up. I advised the wife that if they have any other problems, questions or concerns, to give our office a call back. Wife verbalized understanding. 3 bottles of 30 tablets Lot# [id number]  Exp: 09/2025  FYI

## 2023-04-27 ENCOUNTER — Encounter (INDEPENDENT_AMBULATORY_CARE_PROVIDER_SITE_OTHER): Payer: Medicare Other | Admitting: Ophthalmology

## 2023-04-27 DIAGNOSIS — H353231 Exudative age-related macular degeneration, bilateral, with active choroidal neovascularization: Secondary | ICD-10-CM | POA: Diagnosis not present

## 2023-04-27 DIAGNOSIS — H35033 Hypertensive retinopathy, bilateral: Secondary | ICD-10-CM

## 2023-04-27 DIAGNOSIS — I10 Essential (primary) hypertension: Secondary | ICD-10-CM

## 2023-04-27 DIAGNOSIS — H43813 Vitreous degeneration, bilateral: Secondary | ICD-10-CM | POA: Diagnosis not present

## 2023-05-26 ENCOUNTER — Other Ambulatory Visit: Payer: Self-pay | Admitting: Cardiology

## 2023-06-09 ENCOUNTER — Encounter (INDEPENDENT_AMBULATORY_CARE_PROVIDER_SITE_OTHER): Payer: Medicare Other | Admitting: Ophthalmology

## 2023-06-09 DIAGNOSIS — H35033 Hypertensive retinopathy, bilateral: Secondary | ICD-10-CM | POA: Diagnosis not present

## 2023-06-09 DIAGNOSIS — H353231 Exudative age-related macular degeneration, bilateral, with active choroidal neovascularization: Secondary | ICD-10-CM

## 2023-06-09 DIAGNOSIS — H43813 Vitreous degeneration, bilateral: Secondary | ICD-10-CM

## 2023-06-09 DIAGNOSIS — I10 Essential (primary) hypertension: Secondary | ICD-10-CM

## 2023-06-22 ENCOUNTER — Telehealth: Payer: Self-pay

## 2023-06-22 NOTE — Telephone Encounter (Signed)
Called pt to inform him that his medication [name] 10 mg/80 mg tablets were at [facility] front desk for him to pick up. I advised the pt that if has any other problems, questions or concerns, to give our office a call back. Pt verbalized understanding.   FYI  3 bottles of 30 tablets  Lot# [id number]   Exp:  09/2025

## 2023-07-21 ENCOUNTER — Encounter (INDEPENDENT_AMBULATORY_CARE_PROVIDER_SITE_OTHER): Payer: Medicare Other | Admitting: Ophthalmology

## 2023-07-21 DIAGNOSIS — I10 Essential (primary) hypertension: Secondary | ICD-10-CM | POA: Diagnosis not present

## 2023-07-21 DIAGNOSIS — H2513 Age-related nuclear cataract, bilateral: Secondary | ICD-10-CM

## 2023-07-21 DIAGNOSIS — H35033 Hypertensive retinopathy, bilateral: Secondary | ICD-10-CM

## 2023-07-21 DIAGNOSIS — H353231 Exudative age-related macular degeneration, bilateral, with active choroidal neovascularization: Secondary | ICD-10-CM | POA: Diagnosis not present

## 2023-07-21 DIAGNOSIS — H43813 Vitreous degeneration, bilateral: Secondary | ICD-10-CM

## 2023-08-10 ENCOUNTER — Ambulatory Visit: Payer: Medicare Other | Attending: Cardiology | Admitting: Cardiology

## 2023-08-10 ENCOUNTER — Encounter: Payer: Self-pay | Admitting: Cardiology

## 2023-08-10 VITALS — BP 116/62 | HR 50 | Resp 16 | Ht 71.0 in | Wt 262.2 lb

## 2023-08-10 DIAGNOSIS — E7800 Pure hypercholesterolemia, unspecified: Secondary | ICD-10-CM | POA: Insufficient documentation

## 2023-08-10 DIAGNOSIS — I10 Essential (primary) hypertension: Secondary | ICD-10-CM | POA: Diagnosis not present

## 2023-08-10 DIAGNOSIS — I2510 Atherosclerotic heart disease of native coronary artery without angina pectoris: Secondary | ICD-10-CM | POA: Insufficient documentation

## 2023-08-10 DIAGNOSIS — I4821 Permanent atrial fibrillation: Secondary | ICD-10-CM | POA: Diagnosis present

## 2023-08-10 DIAGNOSIS — G4733 Obstructive sleep apnea (adult) (pediatric): Secondary | ICD-10-CM | POA: Insufficient documentation

## 2023-08-10 NOTE — Addendum Note (Signed)
Addended by: [name] on: 08/10/2023 10:24 AM   Modules accepted: Orders

## 2023-08-10 NOTE — Patient Instructions (Signed)
Medication Instructions:  Your physician recommends that you continue on your current medications as directed. Please refer to the Current Medication list given to you today.  *If you need a refill on your cardiac medications before your next appointment, please call your pharmacy*   Lab Work: None.  If you have labs (blood work) drawn today and your tests are completely normal, you will receive your results only by: MyChart Message (if you have MyChart) OR A paper copy in the mail If you have any lab test that is abnormal or we need to change your treatment, we will call you to review the results.   Testing/Procedures: How to Prepare for Your Cardiac PET/CT Stress Test:  1. Please do not take these medications before your test:   Medications that may interfere with the cardiac pharmacological stress agent (ex. nitrates - including erectile dysfunction medications, isosorbide mononitrate- [please start to hold this medication the day before the test], tamulosin or beta-blockers) the day of the exam. (Erectile dysfunction medication should be held for at least 72 hrs prior to test) Theophylline containing medications for 12 hours. Dipyridamole 48 hours prior to the test. Your remaining medications may be taken with water.  2. Nothing to eat or drink, except water, 3 hours prior to arrival time.   NO caffeine/decaffeinated products, or chocolate 12 hours prior to arrival.  3. NO perfume, cologne or lotion on chest or abdomen area.       4. Total time is 1 to 2 hours; you may want to bring reading material for the waiting time.  5. Please report to Radiology at the [facility] Main Entrance 30 minutes early for your test.  [address] [place], [place] [address]    Diabetic Preparation:  Hold oral medications. You may take NPH and Lantus insulin. Do not take Humalog or Humulin R (Regular Insulin) the day of your test. Check blood sugars prior to leaving the  house. If able to eat breakfast prior to 3 hour fasting, you may take all medications, including your insulin, Do not worry if you miss your breakfast dose of insulin - start at your next meal. Patients who wear a continuous glucose monitor MUST remove the device prior to scanning.  IF YOU THINK YOU MAY BE PREGNANT, OR ARE NURSING PLEASE INFORM THE TECHNOLOGIST.  In preparation for your appointment, medication and supplies will be purchased.  Appointment availability is limited, so if you need to cancel or reschedule, please call the Radiology Department at [phone] [facility]) OR [phone] [facility])  24 hours in advance to avoid a cancellation fee of $100.00  What to Expect After you Arrive:  Once you arrive and check in for your appointment, you will be taken to a preparation room within the Radiology Department.  A technologist or Nurse will obtain your medical history, verify that you are correctly prepped for the exam, and explain the procedure.  Afterwards,  an IV will be started in your arm and electrodes will be placed on your skin for EKG monitoring during the stress portion of the exam. Then you will be escorted to the PET/CT scanner.  There, staff will get you positioned on the scanner and obtain a blood pressure and EKG.  During the exam, you will continue to be connected to the EKG and blood pressure machines.  A small, safe amount of a radioactive tracer will be injected in your IV to obtain a series of pictures of your heart along with  an injection of a stress agent.    After your Exam:  It is recommended that you eat a meal and drink a caffeinated beverage to counter act any effects of the stress agent.  Drink plenty of fluids for the remainder of the day and urinate frequently for the first couple of hours after the exam.  Your doctor will inform you of your test results within 7-10 business days.  For more information and frequently asked questions, please visit our website :  [email or web address]  For questions about your test or how to prepare for your test, please call: Cardiac Imaging Nurse Navigators Office: [phone]    Follow-Up:   Your next appointment:   1 year(s)  Provider:   Armanda Magic, MD     Other Instructions Dr. Mayford Knife has ordered a chin strap for your cpap mask. Your DME company will contact you about delivery/pick-up.

## 2023-08-10 NOTE — Addendum Note (Signed)
Addended by: Rexene Edison L on: 08/10/2023 10:30 AM   Modules accepted: Orders

## 2023-08-10 NOTE — Progress Notes (Signed)
Cardiology Office Note:    Date:  08/10/2023   ID:  Austin Mcgrath, DOB October 12, 1940, MRN [id number]  PCP:  Darrow Bussing, MD  Cardiologist:  Armanda Magic, MD    Referring MD: Darrow Bussing, MD   Chief Complaint  Patient presents with   Atherosclerosis of native coronary artery of native heart w   Permanent atrial fibrillation    Follow-up    1 year    History of Present Illness:    Austin Mcgrath is a 82 y.o. 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  When I saw him in 2023 he had been having atypical chest pain and shortness of breath.  Nuclear stress test showed no ischemia 05/2022.  2D echo showed normal LV function EF 60 to 65% with mild LVH and mild AI and mildly dilated aortic root and aortic arch at 40 and 41 mm respectively.  He is here today for followup and is doing well.  He had a fall about 5 days ago.  He tripped and fell flat on his chest wall and has had chest wall tenderness since then.  He had no dizziness or syncope prior to the event. He still has residual chest wall tenderness.  He also has had some problems with GERD recently and saw his PCP who put him on a medicine but he does not remember the name.  He is very vague when questioned about chest pain and keeps going back to GERD but cannot get a clear picture of whether he has been having and angina.  He has also been having problems with DOE when out in the yard working. He denies any PND, orthopnea, dizziness, palpitations or syncope.  His wife says that he has LE edema sporadically.  He is compliant with his meds and is tolerating meds with no SE.     He is doing well with his PAP device and thinks that he has gotten used to it.  He tolerates the mask and feels the pressure is adequate.  Since going on PAP he feels rested in the am and has no significant daytime sleepiness.  He denies any significant nasal dryness or nasal congestion. He  has a lot of problems with mouth dryness. He does not think that he snores.    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 Ewing Schlein    Past Surgical History:  Procedure Laterality Date   APPENDECTOMY     BUNIONECTOMY     CARDIAC CATHETERIZATION N/A 04/27/2015   Procedure: Left Heart Cath and Coronary Angiography;  Surgeon: Corky Crafts, MD;  Location: [facility] INVASIVE CV LAB;  Service: Cardiovascular;  Laterality: N/A;   foot sx  2006   HERNIA REPAIR  Age 62   L-Disk L5-S1 (right)  1993    Current Medications: Current Meds  Medication Sig   allopurinol (ZYLOPRIM) 300 MG tablet Take 300 mg by mouth daily.   amLODipine-atorvastatin ([name]) 10-80 MG tablet Take 1 tablet by mouth daily.   apixaban (ELIQUIS) 5 MG TABS tablet Take 1 tablet (5 mg total) by mouth 2 (two) times daily.   benazepril (LOTENSIN) 20 MG tablet Take 1 tablet by mouth once daily   Besifloxacin HCl (BESIVANCE) 0.6 % SUSP 4 days after  going to eye dr   Cholecalciferol (VITAMIN D3) 25 MCG (1000 UT) CAPS Take 25 mcg by mouth daily.   Cyanocobalamin (B-12 PO) Take 1 tablet by mouth daily.   glimepiride (AMARYL) 1 MG tablet Take 1 mg by mouth daily.   hydroxychloroquine (PLAQUENIL) 200 MG tablet Take 200 mg by mouth daily.   Multiple Vitamins-Minerals (ICAPS PO) Take 1 tablet by mouth daily.   Omega 3 1200 MG CAPS Take 4 capsules by mouth daily.   spironolactone (ALDACTONE) 25 MG tablet Take 1 tablet by mouth once daily     Allergies:   Contrast media [iodinated contrast media] and Other   Social History   Socioeconomic History   Marital status: Married    Spouse name: Not on file   Number of children: Not on file   Years of education: Not on file   Highest education level: Not on file  Occupational History   Not on file  Tobacco Use   Smoking status: Former    Current packs/day: 0.00    Types: Cigarettes    Quit date: 10/07/1971    Years since quitting: 51.8   Smokeless tobacco: Never  Vaping Use   Vaping status: Never Used  Substance and Sexual Activity   Alcohol use: No   Drug use: No   Sexual activity: Not on file  Other Topics Concern   Not on file  Social History Narrative   Not on file   Social Determinants of Health   Financial Resource Strain: Not on file  Food Insecurity: No Food Insecurity (05/28/2022)   Hunger Vital Sign    Worried About Running Out of Food in the Last Year: Never true    Ran Out of Food in the Last Year: Never true  Transportation Needs: No Transportation Needs (05/28/2022)   PRAPARE - Administrator, Civil Service (Medical): No    Lack of Transportation (Non-Medical): No  Physical Activity: Not on file  Stress: Not on file  Social Connections: Not on file     Family History: The patient's family history includes Breast cancer in his sister; Colon cancer in his sister; Hypertension in his brother and sister; Lung cancer in his mother. There is no history of  Heart attack or Stroke.  ROS:   Please see the history of present illness.    ROS  All other systems reviewed and negative.   EKGs/Labs/Other Studies Reviewed:    The following studies were reviewed today:  EKG Interpretation Date/Time:  Monday August 10 2023 09:25:44 EST Ventricular Rate:  73 PR Interval:    QRS Duration:  150 QT Interval:  418 QTC Calculation: 460 R Axis:   98  Text Interpretation: Atrial fibrillation Right bundle branch block When compared with ECG of 27-Apr-2015 12:36, Atrial fibrillation has replaced Sinus rhythm Right bundle branch block has replaced Incomplete right bundle branch block Confirmed by Mayford Knife, Akashdeep Chuba ([id number]) on 08/10/2023 9:58:35 AM    Recent Labs: No results found for requested labs within last 365 days.   Recent Lipid Panel    Component Value Date/Time   CHOL 103 02/11/2018 0908   CHOL 111 10/28/2013 0813   TRIG 123 02/11/2018 0908   TRIG 146 10/28/2013 0813   HDL 35 (L) 02/11/2018 0908   HDL 37 (L) 10/28/2013 0813   CHOLHDL 2.9 02/11/2018 0908   CHOLHDL 2.4 06/23/2016 0845   VLDL 23 06/23/2016 0845   LDLCALC 43 02/11/2018 0908   LDLCALC 45 10/28/2013 0813    Physical Exam:    VS:  BP 116/62 (BP Location: Left Arm, Patient Position: Sitting, Cuff Size: Large)   Pulse (!) 50   Resp 16   Ht 5\' 11"  (1.803 m)   Wt 262 lb 3.2 oz (118.9 kg)   SpO2 94%   BMI 36.57 kg/m     Wt Readings from Last 3 Encounters:  08/10/23 262 lb 3.2 oz (118.9 kg)  06/02/22 258 lb (117 kg)  05/20/22 258 lb 6.4 oz (117.2 kg)    GEN: Well nourished, well developed in no acute distress HEENT: Normal NECK: No JVD; No carotid bruits LYMPHATICS: No lymphadenopathy CARDIAC:RRR, no murmurs, rubs, gallops RESPIRATORY:  Clear to auscultation without rales, wheezing or rhonchi  ABDOMEN: Soft, non-tender, non-distended MUSCULOSKELETAL:  trace to 1+ edema; No deformity  SKIN: Warm and dry NEUROLOGIC:  Alert and oriented x 3 PSYCHIATRIC:  Normal affect   ASSESSMENT:    1. Atherosclerosis of native coronary artery of native heart without angina pectoris   2. Benign essential HTN   3. Permanent atrial fibrillation (HCC)   4. Pure hypercholesterolemia   5. OSA (obstructive sleep apnea)    PLAN:    In order of problems listed above:  1.  ASCAD  -nonobstructive by cath.  -Nuclear stress test 05/2022 showed no ischemia and 2D echo showed normal LV function -No aspirin due to DOAC -he has been having chest pain with 2 different descriptions.  The first is clearly chest wall discomfort from a recent fall.  The other pain is more concerning as it comes on with activity but also complains of GERD so cannot discern if he is having actual angina or GERD -I will get a Stress PET CT for evaluation for ischemia -Informed Consent   Shared Decision Making/Informed Consent The risks [chest pain, shortness of breath, cardiac arrhythmias, dizziness, blood pressure fluctuations, myocardial infarction, stroke/transient ischemic attack, nausea, vomiting, allergic reaction, radiation exposure, metallic taste sensation and life-threatening complications (estimated to be 1 in 10,000)], benefits (risk stratification, diagnosing coronary artery disease, treatment guidance) and alternatives of a cardiac PET stress test were discussed in detail with Austin Mcgrath and he agrees to proceed. -Continue drug management with atorvastatin 80 mg daily with as needed refills   2.  HTN  -BP controlled on exam today -Continue drug management with amlodipine 10 mg daily, benazepril 20 mg daily and spironolactone 25 mg daily with as needed refills -outside labs from PCP were personally reviewed and interpreted and showed a stable serum creatinine  1.23 and potassium 4.2 on 06/01/2023  3.  Permanent atrial fibrillation  -He remains in atrial fibrillation on EKG today with controlled ventricular response and denies any palpitations -he denies any bleeding problems in the  DOAC -Continue prescription drug management with apixaban 5 mg twice daily with as needed refills -I have personally reviewed and interpreted outside labs performed by patient's PCP which showed hemoglobin 14.5 on 05/15/2023   4.  Hyperlipidemia  -LDL goal is less than 70.  -I have reviewed his lipids from outside labs from PCP and interpreted them and LDL was 41 and HDL 42 on 12/04/2022 -Continue prescription drug management with atorvastatin 80 mg daily with as needed refills  5.  OSA - The patient is tolerating PAP therapy well without any problems. The PAP download performed by his DME was personally reviewed and interpreted by me today and showed an AHI of 2.6/hr on BiPAP 13/92 cm H2O with 97% compliance in using more than 4 hours nightly.  The patient has been using and benefiting from PAP use and will continue to benefit from therapy.  -I will add on a chin strap to help with mouth dryness    Medication Adjustments/Labs and Tests Ordered: Current medicines are reviewed at length with the patient today.  Concerns regarding medicines are outlined above.  Orders Placed This Encounter  Procedures   EKG 12-Lead   No orders of the defined types were placed in this encounter.   Signed, Armanda Magic, MD  08/10/2023 9:33 AM    [facility]

## 2023-08-17 ENCOUNTER — Encounter: Payer: Self-pay | Admitting: Cardiology

## 2023-08-17 ENCOUNTER — Other Ambulatory Visit: Payer: Self-pay | Admitting: Cardiology

## 2023-08-17 NOTE — Addendum Note (Signed)
Addended by: Quintella Reichert on: 08/17/2023 08:17 AM   Modules accepted: Orders

## 2023-08-19 ENCOUNTER — Telehealth: Payer: Self-pay | Admitting: Pharmacist

## 2023-08-19 NOTE — Telephone Encounter (Signed)
-----   Message from Nurse [name] sent at 08/19/2023  8:13 AM EST ----- Regarding: Patient with medication question  ----- Message ----- From: [name], RN Sent: 08/18/2023   5:21 PM EST To: [name], RN  I honestly can't remember if I sent this to ya'll already or not but pt's daughter was wondering if their 12/2 infusion appt for his Simponi Austin Mcgrath was okay to keep since he is getting the cardiac PET/CT on 12/3. Daughter also asked for Pantoprazole and the Simponi Aria to be added to the medication list and I had asked for her to send me back the doses if she knew them but never got a reply.

## 2023-08-19 NOTE — Telephone Encounter (Signed)
Simponi and pantoprazole added to list. Do not see any issues with having CT the day after infusion

## 2023-08-19 NOTE — Telephone Encounter (Signed)
Call to patient to advise medication list has been updated with protonix and simponi aria. Also advised that per Dr.  Mayford Knife and Pharm D, ok to proceed with infusion on 12/2 and then stress/pet/ct on 12/3. Patient verbalized understanding.

## 2023-08-26 ENCOUNTER — Other Ambulatory Visit: Payer: Self-pay

## 2023-08-26 ENCOUNTER — Encounter (INDEPENDENT_AMBULATORY_CARE_PROVIDER_SITE_OTHER): Payer: Medicare Other | Admitting: Ophthalmology

## 2023-08-26 DIAGNOSIS — I10 Essential (primary) hypertension: Secondary | ICD-10-CM

## 2023-08-26 DIAGNOSIS — H35033 Hypertensive retinopathy, bilateral: Secondary | ICD-10-CM | POA: Diagnosis not present

## 2023-08-26 DIAGNOSIS — H353231 Exudative age-related macular degeneration, bilateral, with active choroidal neovascularization: Secondary | ICD-10-CM

## 2023-08-26 DIAGNOSIS — I4892 Unspecified atrial flutter: Secondary | ICD-10-CM

## 2023-08-26 DIAGNOSIS — H43813 Vitreous degeneration, bilateral: Secondary | ICD-10-CM | POA: Diagnosis not present

## 2023-08-26 MED ORDER — APIXABAN 5 MG PO TABS: 5.0000 mg | ORAL_TABLET | Freq: Two times a day (BID) | ORAL | 2 refills | Status: AC

## 2023-08-26 NOTE — Telephone Encounter (Signed)
Faxed refill request received from [facility] pharmacy for Eliquis.  Pt last saw Dr Mayford Knife 08/10/23, last labs 05/15/23 Creat 1.23 at [facility] per [facility], age 82, weight 118.9kg, based on specified criteria pt is on appropriate dosage of Eliquis 5mg  BID for afib.  Will refill rx.

## 2023-09-02 ENCOUNTER — Other Ambulatory Visit (HOSPITAL_COMMUNITY): Payer: Self-pay | Admitting: *Deleted

## 2023-09-02 ENCOUNTER — Encounter (HOSPITAL_COMMUNITY): Payer: Self-pay

## 2023-09-08 ENCOUNTER — Encounter (HOSPITAL_COMMUNITY)
Admission: RE | Admit: 2023-09-08 | Discharge: 2023-09-08 | Disposition: A | Discharge status: Home / Self Care | Payer: Medicare Other | Source: Ambulatory Visit | Attending: Cardiology | Admitting: Cardiology

## 2023-09-08 DIAGNOSIS — I2510 Atherosclerotic heart disease of native coronary artery without angina pectoris: Secondary | ICD-10-CM | POA: Insufficient documentation

## 2023-09-08 LAB — NM PET CT CARDIAC PERFUSION MULTI W/ABSOLUTE BLOODFLOW
MBFR: 2.03
Rest MBF: 0.73 ml/g/min
Rest Nuclear Isotope Dose: 29.9 mCi
ST Depression (mm): 0 mm
Stress MBF: 1.48 ml/g/min
Stress Nuclear Isotope Dose: 29.9 mCi
TID: 1.07

## 2023-09-08 MED ORDER — REGADENOSON 0.4 MG/5ML IV SOLN
0.4000 mg | Freq: Once | INTRAVENOUS | Status: AC
  Administered 2023-09-08: 0.4 mg via INTRAVENOUS

## 2023-09-08 MED ORDER — RUBIDIUM RB82 GENERATOR (RUBYFILL)
29.8900 | PACK | Freq: Once | INTRAVENOUS | Status: AC
  Administered 2023-09-08: 29.89 via INTRAVENOUS

## 2023-09-08 MED ORDER — REGADENOSON 0.4 MG/5ML IV SOLN
INTRAVENOUS | Status: AC
  Filled 2023-09-08: qty 5

## 2023-09-08 MED ORDER — RUBIDIUM RB82 GENERATOR (RUBYFILL)
29.8800 | PACK | Freq: Once | INTRAVENOUS | Status: AC
  Administered 2023-09-08: 29.88 via INTRAVENOUS

## 2023-09-11 ENCOUNTER — Telehealth: Payer: Self-pay

## 2023-09-11 NOTE — Telephone Encounter (Signed)
-----   Message from Christell Constant sent at 09/08/2023  5:34 PM EST ----- Testing Results WNL.  No change to diagnostic or therapeutic plan.

## 2023-09-11 NOTE — Telephone Encounter (Signed)
Call to patient, spoke to spouse Peggy per DPR and explained that stress test WNL. Also explained he may need adjustment to OSA/cpap settings since there was no coronary artery cause found for chest pain. Peggy verbalizes understanding.

## 2023-09-20 ENCOUNTER — Encounter: Payer: Self-pay | Admitting: Cardiology

## 2023-09-21 NOTE — Telephone Encounter (Signed)
Faxes are located on med assistance fax labeled "[facility] NEEDS PROVIDER SIGNATURE". Both applications just need the provider to sign and fax back to sender. Should I leave them on the fax or would it be easier to index those to patients chart?

## 2023-09-24 ENCOUNTER — Encounter (INDEPENDENT_AMBULATORY_CARE_PROVIDER_SITE_OTHER): Payer: Medicare Other | Admitting: Ophthalmology

## 2023-09-24 ENCOUNTER — Telehealth: Payer: Self-pay | Admitting: Cardiology

## 2023-09-24 ENCOUNTER — Other Ambulatory Visit (HOSPITAL_COMMUNITY): Payer: Self-pay

## 2023-09-24 ENCOUNTER — Telehealth: Payer: Self-pay

## 2023-09-24 ENCOUNTER — Encounter: Payer: Self-pay | Admitting: Cardiology

## 2023-09-24 NOTE — Telephone Encounter (Signed)
error 

## 2023-09-24 NOTE — Telephone Encounter (Addendum)
10/26/23 [name] app received via email. PAP: Application for [name] has been submitted to [employer], via fax. If patient requests an update in the meantime, please refer them to [employer] directly at [phone]

## 2023-09-24 NOTE — Telephone Encounter (Signed)
Patient's Daughter, Pattricia Boss stopped by the front desk to discuss patient's missing medication that was delivered her on 09/17/2023.  This medication cannot be located at this time.  A reissue of the medication is being processed - a faxed shipping exception form has been sent to our main fax number and needs to be signed off on by the provider and re-submitted in order for the medication to be sent back over to [place] in 1 to 2 days expedited shipping.  Please make sure to reach out to [facility] [phone] as she is helping her father with this issue, and would like to know the progress on this issue.  Thank you, in advance.

## 2023-09-24 NOTE — Telephone Encounter (Deleted)
error 

## 2023-09-25 ENCOUNTER — Encounter (INDEPENDENT_AMBULATORY_CARE_PROVIDER_SITE_OTHER): Payer: Medicare Other | Admitting: Ophthalmology

## 2023-09-25 DIAGNOSIS — I10 Essential (primary) hypertension: Secondary | ICD-10-CM | POA: Diagnosis not present

## 2023-09-25 DIAGNOSIS — H35033 Hypertensive retinopathy, bilateral: Secondary | ICD-10-CM

## 2023-09-25 DIAGNOSIS — H2513 Age-related nuclear cataract, bilateral: Secondary | ICD-10-CM

## 2023-09-25 DIAGNOSIS — H43813 Vitreous degeneration, bilateral: Secondary | ICD-10-CM

## 2023-09-25 DIAGNOSIS — H353231 Exudative age-related macular degeneration, bilateral, with active choroidal neovascularization: Secondary | ICD-10-CM

## 2023-09-25 NOTE — Telephone Encounter (Signed)
Returned daughters call as she had left a voicemail on my line.  She had spoken with her mother who I called this morning after locating the patients medications.  She was grateful for my call and happy to hear we do have a process when medications come into office.  I apologized for the confusion and inconvenience this caused.

## 2023-10-01 ENCOUNTER — Telehealth: Payer: Self-pay

## 2023-10-01 NOTE — Telephone Encounter (Signed)
[name] picked up by patient. Eliquis application filled out and on Dr. Norris Cross desk for signature.

## 2023-10-05 ENCOUNTER — Other Ambulatory Visit: Payer: Self-pay

## 2023-10-05 ENCOUNTER — Telehealth: Payer: Self-pay

## 2023-10-05 ENCOUNTER — Other Ambulatory Visit (HOSPITAL_COMMUNITY): Payer: Self-pay

## 2023-10-05 MED ORDER — AMLODIPINE-ATORVASTATIN 10-80 MG PO TABS: 1.0000 | ORAL_TABLET | Freq: Every day | ORAL | 3 refills | Status: DC

## 2023-10-05 NOTE — Telephone Encounter (Signed)
Patient assistance application signature pages found in [place]. Reviewed with patient assistance team, it appears that filled out portions of applications may be with [employer].   Signed eliquis signature page sent over to patient assistance team.  [name] ([employer]) signature page on Dr. Norris Cross desk for signature, also requires signed script.   Call to patient, spoke to patient and dtr Noreene Larsson Simcox who state that neither application has  been received by either manufacturer.   Advised that eliquis application has been sent but caduet application will take additional time as Dr. Mayford Knife is out of town.  Checked with dtr Pattricia Boss that patient just got 90 day supply of caduet delivered and he also picked up 90 day eliquis supply on 08/26/23. Advised that patient may have to get samples if he runs out of eliquis before applications are processed.

## 2023-10-05 NOTE — Telephone Encounter (Signed)
Will send off his application once DOD signs, patient will need at least 2 weeks of samples in the meantime.

## 2023-10-05 NOTE — Progress Notes (Signed)
Printing script for patient assistance app.

## 2023-10-05 NOTE — Addendum Note (Signed)
Addended by: [name] on: 10/05/2023 02:25 PM   Modules accepted: Orders

## 2023-10-12 ENCOUNTER — Telehealth: Payer: Self-pay | Admitting: Cardiology

## 2023-10-12 NOTE — Telephone Encounter (Signed)
 Made DPR aware that provider is not willing to sign provider portion of application until complete application has been received.

## 2023-10-12 NOTE — Telephone Encounter (Signed)
 Office calling to try and get an application for the patient's [name] 1080 mg. Please advise

## 2023-10-15 NOTE — Telephone Encounter (Addendum)
 Patient will bring in [name] application.

## 2023-10-19 NOTE — Telephone Encounter (Signed)
 PT came in with his [employer] ASSISTANCE papers. Put in Dr. Mayford Knife box

## 2023-10-21 ENCOUNTER — Encounter: Payer: Self-pay | Admitting: Pharmacy Technician

## 2023-10-21 ENCOUNTER — Telehealth: Payer: Self-pay | Admitting: Pharmacy Technician

## 2023-10-21 NOTE — Telephone Encounter (Signed)
 PAP: Patient assistance application Eliquis  for has been approved by PAP Companies: [employer] from 10/20/23 to 10/05/24. Medication should be delivered to PAP Delivery: whatever method was selected For further shipping updates, please contact [employer] ([employer]) at [phone] Pt ID is: [id number]    I sent the patient a mychart message to let them know.

## 2023-10-22 NOTE — Telephone Encounter (Signed)
Signed patient assistance application faxed to patient assistance team.

## 2023-10-26 ENCOUNTER — Other Ambulatory Visit (HOSPITAL_COMMUNITY): Payer: Self-pay

## 2023-10-26 ENCOUNTER — Encounter (INDEPENDENT_AMBULATORY_CARE_PROVIDER_SITE_OTHER): Payer: Medicare Other | Admitting: Ophthalmology

## 2023-10-27 ENCOUNTER — Encounter (INDEPENDENT_AMBULATORY_CARE_PROVIDER_SITE_OTHER): Payer: Medicare Other | Admitting: Ophthalmology

## 2023-10-27 NOTE — Telephone Encounter (Signed)
Hi Erica!   Just routing as an Financial planner

## 2023-10-27 NOTE — Telephone Encounter (Signed)
PAP: Patient assistance application [name] for has been approved by PAP Companies: [employer] from 10/27/23 to 10/31/24. Medication should be delivered to PAP Delivery: Provider's office For further shipping updates, please contact [employer] at [phone] Pt ID is: [id number]

## 2023-10-30 ENCOUNTER — Telehealth: Payer: Self-pay

## 2023-10-30 NOTE — Telephone Encounter (Signed)
Called pt and left a message informing him that his medication [name] 10 mg/ 80 mg tablets, from patient assistance, [employer] Patient Assistance was at [employer] front desk at [address] for pt to pick up.  Lot# [id number]  Exp: 09/2025 3 bottles of 30 tablets.

## 2023-11-02 ENCOUNTER — Encounter (INDEPENDENT_AMBULATORY_CARE_PROVIDER_SITE_OTHER): Payer: Medicare Other | Admitting: Ophthalmology

## 2023-11-02 DIAGNOSIS — H353231 Exudative age-related macular degeneration, bilateral, with active choroidal neovascularization: Secondary | ICD-10-CM

## 2023-11-02 DIAGNOSIS — H43813 Vitreous degeneration, bilateral: Secondary | ICD-10-CM

## 2023-11-02 DIAGNOSIS — I10 Essential (primary) hypertension: Secondary | ICD-10-CM | POA: Diagnosis not present

## 2023-11-02 DIAGNOSIS — H35033 Hypertensive retinopathy, bilateral: Secondary | ICD-10-CM

## 2023-11-02 DIAGNOSIS — H2513 Age-related nuclear cataract, bilateral: Secondary | ICD-10-CM

## 2023-12-07 ENCOUNTER — Encounter (INDEPENDENT_AMBULATORY_CARE_PROVIDER_SITE_OTHER): Payer: Medicare Other | Admitting: Ophthalmology

## 2023-12-07 DIAGNOSIS — H43813 Vitreous degeneration, bilateral: Secondary | ICD-10-CM | POA: Diagnosis not present

## 2023-12-07 DIAGNOSIS — H353231 Exudative age-related macular degeneration, bilateral, with active choroidal neovascularization: Secondary | ICD-10-CM

## 2023-12-07 DIAGNOSIS — I10 Essential (primary) hypertension: Secondary | ICD-10-CM

## 2023-12-07 DIAGNOSIS — H35033 Hypertensive retinopathy, bilateral: Secondary | ICD-10-CM | POA: Diagnosis not present

## 2024-01-11 ENCOUNTER — Encounter (INDEPENDENT_AMBULATORY_CARE_PROVIDER_SITE_OTHER): Admitting: Ophthalmology

## 2024-01-11 DIAGNOSIS — H353231 Exudative age-related macular degeneration, bilateral, with active choroidal neovascularization: Secondary | ICD-10-CM

## 2024-01-11 DIAGNOSIS — I10 Essential (primary) hypertension: Secondary | ICD-10-CM | POA: Diagnosis not present

## 2024-01-11 DIAGNOSIS — H43813 Vitreous degeneration, bilateral: Secondary | ICD-10-CM

## 2024-01-11 DIAGNOSIS — H35033 Hypertensive retinopathy, bilateral: Secondary | ICD-10-CM | POA: Diagnosis not present

## 2024-02-15 ENCOUNTER — Telehealth: Payer: Self-pay | Admitting: Pharmacist

## 2024-02-15 NOTE — Telephone Encounter (Signed)
 Received [name]  from [employer] PAP. Call to inform patient. Phone is getting disconnected. Mychart sent

## 2024-02-16 ENCOUNTER — Encounter (INDEPENDENT_AMBULATORY_CARE_PROVIDER_SITE_OTHER): Admitting: Ophthalmology

## 2024-02-16 DIAGNOSIS — I10 Essential (primary) hypertension: Secondary | ICD-10-CM

## 2024-02-16 DIAGNOSIS — H43813 Vitreous degeneration, bilateral: Secondary | ICD-10-CM

## 2024-02-16 DIAGNOSIS — H353231 Exudative age-related macular degeneration, bilateral, with active choroidal neovascularization: Secondary | ICD-10-CM

## 2024-02-16 DIAGNOSIS — H35033 Hypertensive retinopathy, bilateral: Secondary | ICD-10-CM

## 2024-03-22 ENCOUNTER — Encounter (INDEPENDENT_AMBULATORY_CARE_PROVIDER_SITE_OTHER): Admitting: Ophthalmology

## 2024-03-22 DIAGNOSIS — H35033 Hypertensive retinopathy, bilateral: Secondary | ICD-10-CM

## 2024-03-22 DIAGNOSIS — I10 Essential (primary) hypertension: Secondary | ICD-10-CM

## 2024-03-22 DIAGNOSIS — H43813 Vitreous degeneration, bilateral: Secondary | ICD-10-CM | POA: Diagnosis not present

## 2024-03-22 DIAGNOSIS — H2513 Age-related nuclear cataract, bilateral: Secondary | ICD-10-CM

## 2024-03-22 DIAGNOSIS — H353231 Exudative age-related macular degeneration, bilateral, with active choroidal neovascularization: Secondary | ICD-10-CM | POA: Diagnosis not present

## 2024-04-26 ENCOUNTER — Encounter (INDEPENDENT_AMBULATORY_CARE_PROVIDER_SITE_OTHER): Admitting: Ophthalmology

## 2024-04-26 DIAGNOSIS — I10 Essential (primary) hypertension: Secondary | ICD-10-CM | POA: Diagnosis not present

## 2024-04-26 DIAGNOSIS — H353231 Exudative age-related macular degeneration, bilateral, with active choroidal neovascularization: Secondary | ICD-10-CM | POA: Diagnosis not present

## 2024-04-26 DIAGNOSIS — H2513 Age-related nuclear cataract, bilateral: Secondary | ICD-10-CM

## 2024-04-26 DIAGNOSIS — H35033 Hypertensive retinopathy, bilateral: Secondary | ICD-10-CM

## 2024-04-26 DIAGNOSIS — H43813 Vitreous degeneration, bilateral: Secondary | ICD-10-CM | POA: Diagnosis not present

## 2024-05-26 ENCOUNTER — Telehealth: Payer: Self-pay | Admitting: Pharmacist

## 2024-05-26 NOTE — Telephone Encounter (Signed)
 Patient came to his wife's appt this morning. Gave him [name]  patient assistance medication and he requested an appt with Dr Shlomo. Routed message to scheduling.

## 2024-05-30 ENCOUNTER — Encounter (INDEPENDENT_AMBULATORY_CARE_PROVIDER_SITE_OTHER): Admitting: Ophthalmology

## 2024-05-30 DIAGNOSIS — H353231 Exudative age-related macular degeneration, bilateral, with active choroidal neovascularization: Secondary | ICD-10-CM

## 2024-05-30 DIAGNOSIS — H43813 Vitreous degeneration, bilateral: Secondary | ICD-10-CM

## 2024-05-30 DIAGNOSIS — H35033 Hypertensive retinopathy, bilateral: Secondary | ICD-10-CM | POA: Diagnosis not present

## 2024-05-30 DIAGNOSIS — I10 Essential (primary) hypertension: Secondary | ICD-10-CM | POA: Diagnosis not present

## 2024-07-04 ENCOUNTER — Encounter (INDEPENDENT_AMBULATORY_CARE_PROVIDER_SITE_OTHER): Admitting: Ophthalmology

## 2024-07-04 DIAGNOSIS — H353231 Exudative age-related macular degeneration, bilateral, with active choroidal neovascularization: Secondary | ICD-10-CM

## 2024-07-04 DIAGNOSIS — I10 Essential (primary) hypertension: Secondary | ICD-10-CM

## 2024-07-04 DIAGNOSIS — H43813 Vitreous degeneration, bilateral: Secondary | ICD-10-CM | POA: Diagnosis not present

## 2024-07-04 DIAGNOSIS — H35033 Hypertensive retinopathy, bilateral: Secondary | ICD-10-CM | POA: Diagnosis not present

## 2024-08-03 ENCOUNTER — Telehealth: Payer: Self-pay | Admitting: Pharmacist

## 2024-08-03 NOTE — Telephone Encounter (Signed)
 Called patient to let him know [name]  patient assistance has come in. No answer, lmom

## 2024-08-08 ENCOUNTER — Telehealth: Payer: Self-pay | Admitting: Pharmacy Technician

## 2024-08-08 NOTE — Telephone Encounter (Signed)
   I called the patient and he would like for us  to send application out again to see if he qualifies for 2026. No hf, no cardiomyopathy so can't do hw grant  Mailed app 08/08/24

## 2024-08-10 ENCOUNTER — Encounter (INDEPENDENT_AMBULATORY_CARE_PROVIDER_SITE_OTHER): Admitting: Ophthalmology

## 2024-08-10 DIAGNOSIS — H43813 Vitreous degeneration, bilateral: Secondary | ICD-10-CM

## 2024-08-10 DIAGNOSIS — H353231 Exudative age-related macular degeneration, bilateral, with active choroidal neovascularization: Secondary | ICD-10-CM | POA: Diagnosis not present

## 2024-08-10 DIAGNOSIS — H35033 Hypertensive retinopathy, bilateral: Secondary | ICD-10-CM

## 2024-08-10 DIAGNOSIS — I10 Essential (primary) hypertension: Secondary | ICD-10-CM

## 2024-08-22 NOTE — Telephone Encounter (Signed)
 I called and spoke to the wife and she is working on application and will send back

## 2024-08-23 ENCOUNTER — Other Ambulatory Visit: Payer: Self-pay | Admitting: Cardiology

## 2024-08-30 ENCOUNTER — Encounter: Payer: Self-pay | Admitting: Cardiology

## 2024-08-31 ENCOUNTER — Other Ambulatory Visit (HOSPITAL_COMMUNITY): Payer: Self-pay

## 2024-08-31 NOTE — Telephone Encounter (Signed)
 Hi, can someone please get the provider to sign the form that is scanned in media under Eliquis  [employer] ([employer]) PROVIDER APPLICATION for this patient and fax back to [phone]? Thank you!

## 2024-08-31 NOTE — Telephone Encounter (Signed)
 I was unable to locate any drug coverage on file for patient. Likely [employer] sent out a default medicare letter that does not apply to patient. [employer] will not start reviewing renewals until after 10/06/24 so patient should disregard letter.

## 2024-09-05 NOTE — Telephone Encounter (Signed)
 I placed the provider portion in Dr. Dorine box for signature

## 2024-09-06 NOTE — Telephone Encounter (Signed)
 I called the patient and he wants to wait to see if he can meet the 3%. he has enough eliquis  from [employer] til feb.   he has Plan f and a/b medicare no part d. So he would have to meet the 3% in 2026.   Cheapest option out of xarelto, eliquis , pradaxa is 56.66 for 30 days at [facility] on goodrx for dabigatran. Warfarin would be 4.00 at [facility] on their discount drug list or 5.00 on our discount drug list.   Per [employer]:if they have just have a/b insurance but no part d they get a credit to go towards that 3% that has to be met for 2026. if they have part d, they do not get that credit. if they have any type of medicare, they will have to of met the 3% in 2026 to get approved for 2026. if they have no insurance at all - no a/b, etc, they do not have to meet that 3%. she said she couldnt tell me the credit amount until the individual is processed. the apps for 2026 will be processed on or after 12/15. for hoh for 1 they can not exceed 46,950 a year, for hoh of 2 can not exceed 63,450 a year. she said all the applications that have been sent in for renewal for 2026 will be denied on 12/15 if they have any type of medicare b/c of that 3%. she said come 2026, all the prescriptions they get on cash price or goodrx amounts can be used towards that 3%.

## 2024-09-06 NOTE — Telephone Encounter (Signed)
 Income is 34,084 for both so dont qualify for extra help

## 2024-09-13 ENCOUNTER — Ambulatory Visit: Attending: Cardiology | Admitting: Cardiology

## 2024-09-13 ENCOUNTER — Encounter: Payer: Self-pay | Admitting: Cardiology

## 2024-09-13 ENCOUNTER — Telehealth: Payer: Self-pay

## 2024-09-13 ENCOUNTER — Telehealth: Payer: Self-pay | Admitting: Pharmacy Technician

## 2024-09-13 VITALS — BP 130/70 | HR 76 | Ht 71.0 in | Wt 258.0 lb

## 2024-09-13 DIAGNOSIS — I4821 Permanent atrial fibrillation: Secondary | ICD-10-CM | POA: Diagnosis not present

## 2024-09-13 DIAGNOSIS — I2510 Atherosclerotic heart disease of native coronary artery without angina pectoris: Secondary | ICD-10-CM | POA: Diagnosis not present

## 2024-09-13 DIAGNOSIS — G4733 Obstructive sleep apnea (adult) (pediatric): Secondary | ICD-10-CM | POA: Diagnosis not present

## 2024-09-13 DIAGNOSIS — E7800 Pure hypercholesterolemia, unspecified: Secondary | ICD-10-CM

## 2024-09-13 DIAGNOSIS — I10 Essential (primary) hypertension: Secondary | ICD-10-CM

## 2024-09-13 DIAGNOSIS — Z79899 Other long term (current) drug therapy: Secondary | ICD-10-CM

## 2024-09-13 NOTE — Progress Notes (Signed)
 Cardiology Office Note:    Date:  09/13/2024   ID:  Austin Mcgrath, DOB 06/11/1941, MRN [id number]  PCP:  Regino Slater, MD  Cardiologist:  Wilbert Bihari, MD    Referring MD: Regino Slater, MD   Chief Complaint  Patient presents with   Coronary Artery Disease   Hypertension   Atrial Fibrillation   Hyperlipidemia   Sleep Apnea    History of Present Illness:    Austin Mcgrath is a 83 y.o. male with a hx of nonobstructive ASCAD by cath 2016, HTN, OSA, paroxysmal atrial flutter on Eliquis  and dyslipidemia.  When I saw him in 2023 he had been having atypical chest pain and shortness of breath.  Nuclear stress test showed no ischemia 05/2022.  2D echo showed normal LV function EF 60 to 65% with mild LVH and mild AI and mildly dilated aortic root and aortic arch at 40 and 41 mm respectively.  Patient is here today for follow-up and is doing well.  He has chronic DOE that is stable. He denies any chest pain or pressure, PND, orthopnea, palpitations or syncope. He occasionally has some mild LE edema.   He is doing well with his PAP device and thinks that he has gotten used to it.  He tolerates the nasal mask and feels the pressure is adequate.  He does not really feel rested in the am.  He naps occasionally during the day. He goes to bed at 9-10PM and gets up at 3-7am depending on how well he slept.  He gets up once to use the restroom at 3-4am.  He denies any significant mouth or nasal congestion.  He has a lot of nasal dryness.  Patient denies any episodes of bruxism,  No gagging hallucinations or cataplectic events.  He occasionally has some restless legs.   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 Rosalie    Past Surgical History:  Procedure Laterality Date   APPENDECTOMY     BUNIONECTOMY     CARDIAC CATHETERIZATION N/A 04/27/2015   Procedure: Left Heart Cath and Coronary Angiography;  Surgeon: Candyce GORMAN Reek, MD;  Location: [facility] INVASIVE CV LAB;  Service: Cardiovascular;  Laterality: N/A;   foot sx  2006   HERNIA REPAIR  Age 10   L-Disk L5-S1 (right)  1993  Current Medications: Current Meds  Medication Sig   allopurinol (ZYLOPRIM) 300 MG tablet Take 300 mg by mouth daily.   amLODipine -atorvastatin  ([name] ) 10-80 MG tablet Take 1 tablet by mouth daily.   apixaban  (ELIQUIS ) 5 MG TABS tablet Take 1 tablet (5 mg total) by mouth 2 (two) times daily.   benazepril  (LOTENSIN ) 20 MG tablet Take 1 tablet by mouth once daily   Besifloxacin HCl (BESIVANCE) 0.6 % SUSP 4 days after going to eye dr   Cholecalciferol (VITAMIN D3) 25 MCG (1000 UT) CAPS Take 25 mcg by mouth daily.   Cyanocobalamin (B-12 PO) Take 1 tablet by mouth daily.   glimepiride (AMARYL) 1 MG tablet Take 1 mg by mouth daily.   golimumab (SIMPONI ARIA) 50 MG/4ML SOLN injection Inject 50 mg into the vein once.   hydroxychloroquine (PLAQUENIL) 200 MG tablet Take 200 mg by mouth daily.   Multiple Vitamins-Minerals  (ICAPS PO) Take 1 tablet by mouth daily.   Omega 3 1200 MG CAPS Take 4 capsules by mouth daily.   pantoprazole (PROTONIX) 40 MG tablet Take 40 mg by mouth daily.   spironolactone  (ALDACTONE ) 25 MG tablet Take 1 tablet by mouth once daily     Allergies:   Contrast media [iodinated contrast media] and Other   Social History   Socioeconomic History   Marital status: Married    Spouse name: Not on file   Number of children: Not on file   Years of education: Not on file   Highest education level: Not on file  Occupational History   Not on file  Tobacco Use   Smoking status: Former    Current packs/day: 0.00    Types: Cigarettes    Quit date: 10/07/1971    Years since quitting: 52.9   Smokeless tobacco: Never  Vaping Use   Vaping status: Never Used  Substance and Sexual Activity   Alcohol use: No   Drug use: No   Sexual activity: Not on file  Other Topics Concern   Not on file  Social History Narrative   Not on file   Social Drivers of Health   Financial Resource Strain: Not on file  Food Insecurity: No Food Insecurity (05/28/2022)   Hunger Vital Sign    Worried About Running Out of Food in the Last Year: Never true    Ran Out of Food in the Last Year: Never true  Transportation Needs: No Transportation Needs (05/28/2022)   PRAPARE - Transportation    Lack of Transportation (Medical): No    Lack of Transportation (Non-Medical): No  Physical Activity: Not on file  Stress: Not on file  Social Connections: Not on file     Family History: The patient's family history includes Breast cancer in his sister; Colon cancer in his sister; Hypertension in his brother and sister; Lung cancer in his mother. There is no history of Heart attack or Stroke.  ROS:   Please see the history of present illness.    ROS  All other systems reviewed and negative.   EKGs/Labs/Other Studies Reviewed:    The following studies were reviewed today:  EKG Interpretation Date/Time:  Tuesday  September 13 2024 10:00:49 EST Ventricular Rate:  76 PR Interval:    QRS Duration:  156 QT Interval:  414 QTC Calculation: 465 R Axis:   96  Text Interpretation: Atrial fibrillation Right bundle branch block When compared with ECG of 10-Aug-2023 09:25, No significant change was found Confirmed by Shlomo Corning ([id number]) on 09/13/2024 10:09:31 AM  Recent Labs: No results found for requested labs within last 365 days.   Recent Lipid Panel    Component Value Date/Time   CHOL 103 02/11/2018 0908   CHOL 111 10/28/2013 0813   TRIG 123 02/11/2018 0908   TRIG 146 10/28/2013 0813   HDL 35 (L) 02/11/2018 0908   HDL 37 (L) 10/28/2013 0813   CHOLHDL 2.9 02/11/2018 0908   CHOLHDL 2.4 06/23/2016 0845   VLDL 23 06/23/2016 0845   LDLCALC 43 02/11/2018 0908   LDLCALC 45 10/28/2013 0813    Physical Exam:    VS:  BP 130/70   Pulse 76   Ht 5' 11 (1.803 m)   Wt 258 lb (117 kg)   SpO2 98%   BMI 35.98 kg/m     Wt Readings from Last 3 Encounters:  09/13/24 258 lb (117 kg)  08/10/23 262 lb 3.2 oz (118.9 kg)  06/02/22 258 lb (117 kg)    GEN: Well nourished, well developed in no acute distress HEENT: Normal NECK: No JVD; No carotid bruits LYMPHATICS: No lymphadenopathy CARDIAC:irregularly irregular, no murmurs, rubs, gallops RESPIRATORY:  Clear to auscultation without rales, wheezing or rhonchi  ABDOMEN: Soft, non-tender, non-distended MUSCULOSKELETAL:  No edema; No deformity  SKIN: Warm and dry NEUROLOGIC:  Alert and oriented x 3 PSYCHIATRIC:  Normal affect  ASSESSMENT:    1. Atherosclerosis of native coronary artery of native heart without angina pectoris   2. Benign essential HTN   3. Permanent atrial fibrillation (HCC)   4. Pure hypercholesterolemia   5. OSA (obstructive sleep apnea)     PLAN:    In order of problems listed above:  1.  ASCAD  -nonobstructive by cath.  -Nuclear stress test 05/2022 showed no ischemia and 2D echo showed normal LV function -Stress PET CT for  atypical chest pain 09/25/2023 showed no ischemia, normal blood flow reserve and normal LV function -He denies any anginal symptoms since I saw him last -Continue [name]  10/80 mg daily -No ASA due to DOAC  2.  HTN  -BP controlled on exam today -Continue amlodipine  10 mg daily, benazepril  20 mg daily, spironolactone  25 mg daily with as needed refills -outside labs from PCP were personally reviewed and interpreted and showed serum creatinine 1.32 on 07/15/2024 potassium 4.3 on 06/21/2024  3.  Permanent atrial fibrillation  - Heart rate is well-controlled and permanent atrial fibrillation - No bleeding problems on DOAC - Continue apixaban  5 mg twice daily with as needed refills - He has not required any heart rate lowering medication -I have personally reviewed and interpreted outside labs performed by patient's PCP which showed hemoglobin 15.6 on 07/15/2024   4.  Hyperlipidemia  -LDL goal is less than 70.  -I have reviewed his lipids from outside labs from PCP and interpreted them and LDL 35 and HDL 40 on 12/15/2023 - Continue atorvastatin  ([name] ) 80 mg daily with as needed refills - Repeat FLP and ALT April 2026  5.  OSA - The patient is tolerating PAP therapy well without any problems. The PAP download performed by his DME was personally reviewed and interpreted by me today and showed an AHI of 2.8 /hr on BiPAP at 9/4 cm H2O with 93% compliance in using more than 4 hours nightly.  The patient has been using and benefiting from PAP use and will continue to benefit from therapy.     Medication Adjustments/Labs and Tests Ordered: Current medicines are reviewed at length with the patient today.  Concerns regarding medicines are  outlined above.  Orders Placed This Encounter  Procedures   EKG 12-Lead   No orders of the defined types were placed in this encounter.  Followup with me in 1 year   Signed, Wilbert Bihari, MD  09/13/2024 10:21 AM    [facility]

## 2024-09-13 NOTE — Telephone Encounter (Signed)
 Austin Mcgrath

## 2024-09-13 NOTE — Telephone Encounter (Signed)
   [employer] approved

## 2024-09-13 NOTE — Telephone Encounter (Signed)
 Faxed application for caduet  10/80mg  to [employer]

## 2024-09-13 NOTE — Addendum Note (Signed)
 Addended by: JANIT GENI CROME on: 09/13/2024 10:26 AM   Modules accepted: Orders

## 2024-09-13 NOTE — Telephone Encounter (Signed)
 He doesn't qualify for a grant. Received application back from provider and scanned in media. Waiting to see if they meet that 3% since that will be needed with this application

## 2024-09-13 NOTE — Patient Instructions (Signed)
 Medication Instructions:  Your physician recommends that you continue on your current medications as directed. Please refer to the Current Medication list given to you today.  *If you need a refill on your cardiac medications before your next appointment, please call your pharmacy*  Lab Work: Please complete a FASTING Lipid Panel and a CMET in March of 2026. You can walk into any [facility] to complete.   If you have labs (blood work) drawn today and your tests are completely normal, you will receive your results only by: MyChart Message (if you have MyChart) OR A paper copy in the mail If you have any lab test that is abnormal or we need to change your treatment, we will call you to review the results.  Testing/Procedures: None.  Follow-Up: At [facility], you and your health needs are our priority.  As part of our continuing mission to provide you with exceptional heart care, our providers are all part of one team.  This team includes your primary Cardiologist (physician) and Advanced Practice Providers or APPs (Physician Assistants and Nurse Practitioners) who all work together to provide you with the care you need, when you need it.  Your next appointment:   1 year(s)  Provider:   Wilbert Bihari, MD

## 2024-09-14 ENCOUNTER — Encounter (INDEPENDENT_AMBULATORY_CARE_PROVIDER_SITE_OTHER): Admitting: Ophthalmology

## 2024-09-14 DIAGNOSIS — H353231 Exudative age-related macular degeneration, bilateral, with active choroidal neovascularization: Secondary | ICD-10-CM

## 2024-09-14 DIAGNOSIS — H35033 Hypertensive retinopathy, bilateral: Secondary | ICD-10-CM | POA: Diagnosis not present

## 2024-09-14 DIAGNOSIS — H43813 Vitreous degeneration, bilateral: Secondary | ICD-10-CM | POA: Diagnosis not present

## 2024-09-14 DIAGNOSIS — I10 Essential (primary) hypertension: Secondary | ICD-10-CM | POA: Diagnosis not present

## 2024-10-19 ENCOUNTER — Encounter (INDEPENDENT_AMBULATORY_CARE_PROVIDER_SITE_OTHER): Admitting: Ophthalmology

## 2024-10-19 DIAGNOSIS — I10 Essential (primary) hypertension: Secondary | ICD-10-CM

## 2024-10-19 DIAGNOSIS — H353221 Exudative age-related macular degeneration, left eye, with active choroidal neovascularization: Secondary | ICD-10-CM | POA: Diagnosis not present

## 2024-10-19 DIAGNOSIS — H35033 Hypertensive retinopathy, bilateral: Secondary | ICD-10-CM | POA: Diagnosis not present

## 2024-10-19 DIAGNOSIS — H43813 Vitreous degeneration, bilateral: Secondary | ICD-10-CM

## 2024-10-20 ENCOUNTER — Encounter (INDEPENDENT_AMBULATORY_CARE_PROVIDER_SITE_OTHER): Admitting: Ophthalmology

## 2024-11-07 ENCOUNTER — Encounter: Payer: Self-pay | Admitting: Cardiology

## 2024-11-07 ENCOUNTER — Telehealth: Payer: Self-pay | Admitting: Cardiology

## 2024-11-07 ENCOUNTER — Other Ambulatory Visit (HOSPITAL_COMMUNITY): Payer: Self-pay

## 2024-11-07 MED ORDER — AMLODIPINE-ATORVASTATIN 10-80 MG PO TABS
1.0000 | ORAL_TABLET | Freq: Every day | ORAL | 3 refills | Status: AC
  Filled 2024-11-07 – 2024-11-08 (×2): qty 30, 30d supply, fill #0

## 2024-11-07 NOTE — Telephone Encounter (Signed)
 Error

## 2024-11-07 NOTE — Telephone Encounter (Signed)
" °*  STAT* If patient is at the pharmacy, call can be transferred to refill team.   1. Which medications need to be refilled? (please list name of each medication and dose if known) amLODipine -atorvastatin  ([name] ) 10-80 MG tablet    2. Would you like to learn more about the convenience, safety, & potential cost savings by using the [facility]?   3. Are you open to using the [facility] (Type [facility].    4. Which pharmacy/location (including street and city if local pharmacy) is medication to be sent to?   - [facility]    5. Do they need a 30 day or 90 day supply? 90  "

## 2024-11-07 NOTE — Telephone Encounter (Signed)
 Refill sent

## 2024-11-08 ENCOUNTER — Encounter (HOSPITAL_COMMUNITY): Payer: Self-pay

## 2024-11-08 ENCOUNTER — Other Ambulatory Visit (HOSPITAL_COMMUNITY): Payer: Self-pay

## 2024-11-22 ENCOUNTER — Encounter (INDEPENDENT_AMBULATORY_CARE_PROVIDER_SITE_OTHER): Admitting: Ophthalmology
